# Patient Record
Sex: Female | Born: 1995 | Race: Black or African American | Hispanic: No | Marital: Single | Smoking: Never smoker
Health system: Southern US, Community
[De-identification: ages and names within clinical notes are randomized; demographics above are authoritative.]

## PROBLEM LIST (undated history)

## (undated) DIAGNOSIS — Z789 Other specified health status: Secondary | ICD-10-CM

## (undated) DIAGNOSIS — E559 Vitamin D deficiency, unspecified: Secondary | ICD-10-CM

## (undated) DIAGNOSIS — I2699 Other pulmonary embolism without acute cor pulmonale: Secondary | ICD-10-CM

## (undated) HISTORY — PX: NO PAST SURGERIES: SHX2092

## (undated) HISTORY — DX: Vitamin D deficiency, unspecified: E55.9

---
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
## 2016-11-17 ENCOUNTER — Encounter: Payer: Self-pay | Admitting: Physician Assistant

## 2016-11-17 ENCOUNTER — Ambulatory Visit: Payer: Self-pay | Admitting: Physician Assistant

## 2016-11-17 VITALS — BP 142/80 | HR 89 | Temp 98.3°F

## 2016-11-17 DIAGNOSIS — M549 Dorsalgia, unspecified: Secondary | ICD-10-CM

## 2016-11-17 LAB — POCT URINALYSIS DIPSTICK
Bilirubin, UA: NEGATIVE
Blood, UA: NEGATIVE
GLUCOSE UA: NEGATIVE
Ketones, UA: NEGATIVE
LEUKOCYTES UA: NEGATIVE
Nitrite, UA: NEGATIVE
Spec Grav, UA: 1.02
UROBILINOGEN UA: NEGATIVE
pH, UA: 7

## 2016-11-17 LAB — POCT URINE PREGNANCY: PREG TEST UR: NEGATIVE

## 2016-11-17 MED ORDER — MELOXICAM 15 MG PO TABS: 15.0000 mg | ORAL_TABLET | Freq: Every day | ORAL | 0 refills | Status: DC

## 2016-11-17 MED ORDER — BACLOFEN 10 MG PO TABS: 10.0000 mg | ORAL_TABLET | Freq: Three times a day (TID) | ORAL | 0 refills | Status: DC

## 2016-11-17 NOTE — Progress Notes (Signed)
S: c/o left sided back pain, increased pain with movement, no known injury, no uti sx, no abd pain, some radiation to top of thigh, nootc meds  O: vitals wnl, nad, spine is nontender, full rom, + large spasm in flank area on left side of back, n/v intact, ua wnl, urine preg neg  A: back pain with muscle spasms  P: mobic , baclofen, wet heat followed by ice, stretches

## 2016-12-17 ENCOUNTER — Ambulatory Visit: Payer: Self-pay | Admitting: Physician Assistant

## 2017-02-15 ENCOUNTER — Ambulatory Visit: Payer: Self-pay | Admitting: Physician Assistant

## 2017-04-06 LAB — HM PAP SMEAR: HM PAP: NORMAL

## 2017-06-10 ENCOUNTER — Ambulatory Visit: Payer: Self-pay | Admitting: Physician Assistant

## 2017-06-10 VITALS — BP 110/79 | HR 98 | Temp 98.5°F | Resp 16 | Ht 65.0 in | Wt 180.0 lb

## 2017-06-10 DIAGNOSIS — Z0000 Encounter for general adult medical examination without abnormal findings: Secondary | ICD-10-CM

## 2017-06-10 DIAGNOSIS — Z299 Encounter for prophylactic measures, unspecified: Secondary | ICD-10-CM

## 2017-06-10 NOTE — Patient Instructions (Addendum)
Eating Healthy on a Budget There are many ways to save money at the grocery store and continue to eat healthy. You can be successful if you plan your meals according to your budget, purchase according to your budget and grocery list, and prepare food yourself. How can I buy more food on a limited budget? Plan  Plan meals and snacks according to a grocery list and budget you create.  Look for recipes where you can cook once and make enough food for two meals.  Include meals that will "stretch" more expensive foods such as stews, casseroles, and stir-fry dishes.  Make a grocery list and make sure to bring it with you to the store. If you have a smart phone, you could use your phone to create your shopping list. Purchase  When grocery shopping, buy only the items on your grocery list and go only to the areas of the store that have the items on your list. Prepare  Some meal items can be prepared in advance. Pre-cook on days when you have extra time.  Make extra food (such as by doubling recipes) and freeze the extras in meal-sized containers or in individual portions for fast meals and snacks.  Use leftovers in your meal plan for the week.  Try some meatless meals or try "no cook" meals like salads.  When you come home from the grocery store, wash and prepare your fruits and vegetables so they are ready to use and eat. This will help reduce food waste. How can I buy more food on a limited budget? Try these tips the next time you go shopping:  Buy store brands or generic brands.  Use coupons only for foods and brands you normally buy. Avoid buying items you wouldn't normally buy simply because they are on sale.  Check online and in newspapers for weekly deals.  Buy healthy items from the bulk bins when available, such as herbs, spices, flours, pastas, nuts, and dried fruit.  Buy fruits and vegetables that are in season. Prices are usually lower on in-season produce.  Compare and  contrast different items. You can do this by looking at the unit price on the price tag. Use it to compare different brands and sizes to find out which item is the best deal.  Choose naturally low-cost healthy items, such as carrots, potatoes, apples, bananas, and oranges. Dried or canned beans are a low-cost protein source.  Buy in bulk and freeze extra food. Items you can buy in bulk include meats, fish, poultry, frozen fruits, and frozen vegetables.  Limit the purchase of prepared or "ready-to-eat" foods, such as pre-cut fruits and vegetables and pre-made salads.  If possible, shop around to discover which grocery store offers the best prices. Some stores charge much more than other stores for the same items.  Do not shop when you are hungry. If you shop while hungry, It may be hard to stick to your list and budget.  Stick to your list and resist impulse buys. Treat your list as your official plan for the week.  Buy a variety of vegetables and fruit by purchasing fresh, frozen, and canned items.  Look beyond eye level. Foods at eye level (adult or child eye level) are more expensive. Look at the top and bottom shelves for deals.  Be efficient with your time when shopping. The more time you spend at the store, the more money you are likely to spend.  Consider other retailers such as dollar stores, larger wholesale   stores, local fruit and vegetable stands, and farmers markets.  What are some tips for less expensive food substitutions? When choosing more expensive foods like meats and dairy, try these tips to save money:  Choose cheaper cuts of meat, such as bone-in chicken thighs and drumsticks instead skinless and boneless chicken. When you are ready to prepare the chicken, you can remove the skin yourself to make it healthier.  Choose lean meats like chicken or [place]. When choosing ground beef, make sure it is lean ground beef (92% lean, 8% fat). If you do buy a fattier ground beef,  drain the fat before eating.  Buy dried beans and peas, such as lentils, split peas, or kidney beans.  For seafood, choose canned tuna, salmon, or sardines.  Eggs are a low-cost source of protein.  Buy the larger tubs of yogurt instead of individual-sized containers.  Choose water instead of sodas and other sweetened beverages.  Skip buying chips, cookies, and other "junk food". These items are usually expensive, high in calories, and low in nutritional value.  How can I prepare the foods I buy in the healthiest way? Practice these tips for cooking foods in the healthiest way to reduce excess fat and calorie intake:  Steam, saute, grill, or bake foods instead of frying them.  Make sure half your plate is filled with fruits or vegetables. Choose from fresh, frozen, or canned fruits and vegetables. If eating canned, remember to rinse them before eating. This will remove any excess salt added for packaging.  Trim all fat from meat before cooking. Remove the skin from chicken or [place].  Spoon off fat from meat dishes once they have been chilled in the refrigerator and the fat has hardened on the top.  Use skim milk, low-fat milk, or evaporated skim milk when making cream sauces, soups, or puddings.  Substitute low-fat yogurt, sour cream, or cottage cheese for sour cream and mayonnaise in dips and dressings.  Try lemon juice, herbs, or spices to season food instead of salt, butter, or margarine.  This information is not intended to replace advice given to you by your health care provider. Make sure you discuss any questions you have with your health care provider. Document Released: 07/27/2014 Document Revised: 06/12/2016 Document Reviewed: 06/26/2014 Elsevier Interactive Patient Education  2018 [employer]. Preventing Unhealthy [employer], Adult Staying at a healthy weight is important. When fat builds up in your body, you may become overweight or obese. These conditions put you at  greater risk for developing certain health problems, such as heart disease, diabetes, sleeping problems, joint problems, and some cancers. Unhealthy weight gain is often the result of making unhealthy choices in what you eat. It is also a result of not getting enough exercise. You can make changes to your lifestyle to prevent obesity and stay as healthy as possible. What nutrition changes can be made? To maintain a healthy weight and prevent obesity:  Eat only as much as your body needs. To do this: ? Pay attention to signs that you are hungry or full. Stop eating as soon as you feel full. ? If you feel hungry, try drinking water first. Drink enough water so your urine is clear or pale yellow. ? Eat smaller portions. ? Look at serving sizes on food labels. Most foods contain more than one serving per container. ? Eat the recommended amount of calories for your gender and activity level. While most active people should eat around 2,000 calories per day, if you  are trying to lose weight or are not very active, you main need to eat less calories. Talk to your health care provider or dietitian about how many calories you should eat each day.  Choose healthy foods, such as: ? Fruits and vegetables. Try to fill at least half of your plate at each meal with fruits and vegetables. ? Whole grains, such as whole wheat bread, brown rice, and quinoa. ? Lean meats, such as chicken or fish. ? Other healthy proteins, such as beans, eggs, or tofu. ? Healthy fats, such as nuts, seeds, fatty fish, and olive oil. ? Low-fat or fat-free dairy.  Check food labels and avoid food and drinks that: ? Are high in calories. ? Have added sugar. ? Are high in sodium. ? Have saturated fats or trans fats.  Limit how much you eat of the following foods: ? Prepackaged meals. ? Fast food. ? Fried foods. ? Processed meat, such as bacon, sausage, and deli meats. ? Fatty cuts of red meat and poultry with skin.  Cook  foods in healthier ways, such as by baking, broiling, or grilling.  When grocery shopping, try to shop around the outside of the store. This helps you buy mostly fresh foods and avoid canned and prepackaged foods.  What lifestyle changes can be made?  Exercise at least 30 minutes 5 or more days each week. Exercising includes brisk walking, yard work, biking, running, swimming, and team sports like basketball and soccer. Ask your health care provider which exercises are safe for you.  Do not use any products that contain nicotine or tobacco, such as cigarettes and e-cigarettes. If you need help quitting, ask your health care provider.  Limit alcohol intake to no more than 1 drink a day for nonpregnant women and 2 drinks a day for men. One drink equals 12 oz of beer, 5 oz of wine, or 1 oz of hard liquor.  Try to get 7-9 hours of sleep each night. What other changes can be made?  Keep a food and activity journal to keep track of: ? What you ate and how many calories you had. Remember to count sauces, dressings, and side dishes. ? Whether you were active, and what exercises you did. ? Your calorie, weight, and activity goals.  Check your weight regularly. Track any changes. If you notice you have gained weight, make changes to your diet or activity routine.  Avoid taking weight-loss medicines or supplements. Talk to your health care provider before starting any new medicine or supplement.  Talk to your health care provider before trying any new diet or exercise plan. Why are these changes important? Eating healthy, staying active, and having healthy habits not only help prevent obesity, they also:  Help you to manage stress and emotions.  Help you to connect with friends and family.  Improve your self-esteem.  Improve your sleep.  Prevent long-term health problems.  What can happen if changes are not made? Being obese or overweight can cause you to develop joint or bone problems,  which can make it hard for you to stay active or do activities you enjoy. Being obese or overweight also puts stress on your heart and lungs and can lead to health problems like diabetes, heart disease, and some cancers. Where to find more information: Talk with your health care provider or a dietitian about healthy eating and healthy lifestyle choices. You may also find other information through these resources:  U.S. Department of Agriculture MyPlate: https://ball-collins.biz/www.choosemyplate.gov  American  Heart Association: www.heart.org  Centers for Disease Control and Prevention: FootballExhibition.com.br  Summary  Staying at a healthy weight is important. It helps prevent certain diseases and health problems, such as heart disease, diabetes, joint problems, sleep disorders, and some cancers.  Being obese or overweight can cause you to develop joint or bone problems, which can make it hard for you to stay active or do activities you enjoy.  You can prevent unhealthy weight gain by eating a healthy diet, exercising regularly, not smoking, limiting alcohol, and getting enough sleep.  Talk with your health care provider or a dietitian for guidance about healthy eating and healthy lifestyle choices. This information is not intended to replace advice given to you by your health care provider. Make sure you discuss any questions you have with your health care provider. Document Released: 11/24/2016 Document Revised: 12/30/2016 Document Reviewed: 12/30/2016 Elsevier Interactive Patient Education  [employer].

## 2017-06-10 NOTE — Progress Notes (Addendum)
Subjective:    Patient ID: Doris Thomas, female    DOB: 1996-11-12, 21 y.o.   MRN: [id number]  HPI Patient is a 21 year old female who presents to clinic for wellness exam. She denies any complaints. She does report wanting information on healthy weight and healthy diet. She denies smoking,drinking and is not sexually active. She requests Gynecology referral and reports recently having a breast exam that was " normal"  Per patients report. Patient defers breast exam at today's visit. Last menstrual period 06/01/17- cycles reported as regular.  No Known Allergies  BMI 29.95 Height 5 foot 5 inches  Weight 180 lbs  . Review of Systems  Constitutional: Negative for activity change, appetite change, chills, diaphoresis, fatigue, fever and unexpected weight change.  HENT: Negative for congestion, dental problem, drooling, ear discharge, ear pain, facial swelling, hearing loss, mouth sores, nosebleeds, postnasal drip, rhinorrhea, sinus pain, sinus pressure, sneezing, sore throat, tinnitus, trouble swallowing and voice change.   Eyes: Negative.   Respiratory: Negative for apnea, cough, choking, chest tightness, shortness of breath, wheezing and stridor.   Cardiovascular: Negative for chest pain, palpitations and leg swelling.  Gastrointestinal: Negative for abdominal distention, abdominal pain, anal bleeding, blood in stool, constipation, diarrhea, nausea, rectal pain and vomiting.  Endocrine: Negative.   Genitourinary: Negative.   Musculoskeletal: Negative.   Skin: Negative.  Negative for color change, pallor, rash and wound.  Allergic/Immunologic: Negative for environmental allergies, food allergies and immunocompromised state.  Neurological: Negative for dizziness, tremors, seizures, syncope, facial asymmetry, speech difficulty, weakness, light-headedness, numbness and headaches.  Hematological: Negative for adenopathy. Does not bruise/bleed easily.  Psychiatric/Behavioral: Negative for  agitation, behavioral problems, confusion, decreased concentration, dysphoric mood, hallucinations, self-injury, sleep disturbance and suicidal ideas. The patient is not nervous/anxious and is not hyperactive.        Objective:   Physical Exam  Constitutional: She is oriented to person, place, and time. She appears well-developed and well-nourished. No distress.  HENT:  Head: Normocephalic and atraumatic.  Right Ear: External ear normal.  Left Ear: External ear normal.  Nose: Nose normal.  Mouth/Throat: Oropharynx is clear and moist. No oropharyngeal exudate.  Eyes: Conjunctivae and EOM are normal. Pupils are equal, round, and reactive to light. Right eye exhibits no discharge. Left eye exhibits no discharge. No scleral icterus.  Neck: Normal range of motion. Neck supple. No JVD present. No tracheal deviation present. No thyromegaly present.  Cardiovascular: Normal rate, regular rhythm, normal heart sounds and intact distal pulses.  Exam reveals no gallop and no friction rub.   No murmur heard. Pulmonary/Chest: Effort normal and breath sounds normal. No stridor. No respiratory distress. She has no wheezes. She has no rales. She exhibits no tenderness.  Abdominal: Soft. Bowel sounds are normal. She exhibits no distension and no mass. There is no tenderness. There is no rebound and no guarding.  Musculoskeletal: Normal range of motion. She exhibits no edema, tenderness or deformity.  Lymphadenopathy:    She has no cervical adenopathy.  Neurological: She is alert and oriented to person, place, and time. She has normal reflexes.  Skin: Skin is warm and dry. No rash noted. She is not diaphoretic. No erythema. No pallor.  Psychiatric: She has a normal mood and affect. Her behavior is normal. Judgment and thought content normal.  Vitals reviewed.         Assessment & Plan:  1. Wellness Exam 2. Provider will update my chart with weight management.  3. Executive panel labs ordered  and drawn  at this visit.  4. Patient will follow up with clinic next week for labs and as needed.  5. GYN referral per patient request for GYN and Breast exam. Patient advised to call within a week if no call from gynecology referral.  6. Patient verbalizes understanding of instructions above and will follow up as needed.

## 2017-06-11 LAB — CMP12+LP+TP+TSH+6AC+CBC/D/PLT
ALT: 12 IU/L (ref 0–32)
AST: 17 IU/L (ref 0–40)
Albumin/Globulin Ratio: 1.3 (ref 1.2–2.2)
Albumin: 4.6 g/dL (ref 3.5–5.5)
Alkaline Phosphatase: 103 IU/L (ref 39–117)
BUN/Creatinine Ratio: 9 (ref 9–23)
BUN: 6 mg/dL (ref 6–20)
Basophils Absolute: 0.1 10*3/uL (ref 0.0–0.2)
Basos: 1 %
Bilirubin Total: 0.3 mg/dL (ref 0.0–1.2)
CALCIUM: 9.6 mg/dL (ref 8.7–10.2)
CHOL/HDL RATIO: 2.6 ratio (ref 0.0–4.4)
CREATININE: 0.69 mg/dL (ref 0.57–1.00)
Chloride: 101 mmol/L (ref 96–106)
Cholesterol, Total: 109 mg/dL (ref 100–199)
EOS (ABSOLUTE): 0.1 10*3/uL (ref 0.0–0.4)
Eos: 1 %
Estimated CHD Risk: <0.5 times avg. (ref 0.0–1.0)
Free Thyroxine Index: 1.5 (ref 1.2–4.9)
GFR calc Af Amer: 143 mL/min/{1.73_m2} (ref >59)
GFR, EST NON AFRICAN AMERICAN: 124 mL/min/{1.73_m2} (ref >59)
GGT: 19 IU/L (ref 0–60)
GLUCOSE: 86 mg/dL (ref 65–99)
Globulin, Total: 3.6 g/dL (ref 1.5–4.5)
HDL: 42 mg/dL (ref >39)
Hematocrit: 41.7 % (ref 34.0–46.6)
Hemoglobin: 13.5 g/dL (ref 11.1–15.9)
Immature Grans (Abs): 0 10*3/uL (ref 0.0–0.1)
Immature Granulocytes: 0 %
Iron: 27 ug/dL (ref 27–159)
LDH: 196 IU/L (ref 119–226)
LDL Calculated: 52 mg/dL (ref 0–99)
Lymphocytes Absolute: 2 10*3/uL (ref 0.7–3.1)
Lymphs: 28 %
MCH: 27.8 pg (ref 26.6–33.0)
MCHC: 32.4 g/dL (ref 31.5–35.7)
MCV: 86 fL (ref 79–97)
MONOCYTES: 6 %
Monocytes Absolute: 0.4 10*3/uL (ref 0.1–0.9)
NEUTROS ABS: 4.5 10*3/uL (ref 1.4–7.0)
Neutrophils: 64 %
PHOSPHORUS: 3.3 mg/dL (ref 2.5–4.5)
POTASSIUM: 4.7 mmol/L (ref 3.5–5.2)
Platelets: 273 10*3/uL (ref 150–379)
RBC: 4.85 x10E6/uL (ref 3.77–5.28)
RDW: 16.5 % — AB (ref 12.3–15.4)
Sodium: 138 mmol/L (ref 134–144)
T3 Uptake Ratio: 26 % (ref 24–39)
T4 TOTAL: 5.9 ug/dL (ref 4.5–12.0)
TSH: 0.917 u[IU]/mL (ref 0.450–4.500)
Total Protein: 8.2 g/dL (ref 6.0–8.5)
Triglycerides: 76 mg/dL (ref 0–149)
URIC ACID: 4.2 mg/dL (ref 2.5–7.1)
VLDL Cholesterol Cal: 15 mg/dL (ref 5–40)
WBC: 7.1 10*3/uL (ref 3.4–10.8)

## 2017-06-11 LAB — VITAMIN D 25 HYDROXY (VIT D DEFICIENCY, FRACTURES): VIT D 25 HYDROXY: 9.8 ng/mL — AB (ref 30.0–100.0)

## 2017-06-12 ENCOUNTER — Encounter: Payer: Self-pay | Admitting: Physician Assistant

## 2017-06-14 MED ORDER — VITAMIN D (ERGOCALCIFEROL) 1.25 MG (50000 UNIT) PO CAPS: 50000.0000 [IU] | ORAL_CAPSULE | ORAL | 3 refills | Status: DC

## 2017-06-14 NOTE — Telephone Encounter (Signed)
Sent to pharmacy 

## 2017-06-14 NOTE — Telephone Encounter (Signed)
Patient wants Vitamin D script called in to [facility] [place].  Thanks

## 2017-07-15 ENCOUNTER — Encounter: Payer: Self-pay | Admitting: Physician Assistant

## 2017-07-15 ENCOUNTER — Ambulatory Visit: Payer: Self-pay | Admitting: Physician Assistant

## 2017-07-15 VITALS — BP 130/80 | HR 106 | Temp 98.9°F

## 2017-07-15 DIAGNOSIS — T50905A Adverse effect of unspecified drugs, medicaments and biological substances, initial encounter: Secondary | ICD-10-CM

## 2017-07-15 MED ORDER — VITAMIN D (ERGOCALCIFEROL) 1.25 MG (50000 UNIT) PO CAPS: 50000.0000 [IU] | ORAL_CAPSULE | ORAL | 3 refills | Status: DC

## 2017-07-15 NOTE — Progress Notes (Signed)
S: had a headache after taking energy pills prior to working out, got so bad Doris Thomas felt like it was a migraine, since Doris Thomas stopped taking the energy pill the headache has gone away, no v/d, no photosensitivity, no change in vision  O: vitals wnl, nad, ent wnl, neck supple no lymph lungs c t a, cv rrr, cn II-XII grossly intact  A: headache secondary to energy pills  P: stop taking energy pills , drink plenty of water to flush medication out, if headaches return without use of energy pills then return to clinic for eval

## 2017-07-22 ENCOUNTER — Encounter: Payer: Self-pay | Admitting: Physician Assistant

## 2017-07-22 ENCOUNTER — Ambulatory Visit: Payer: Self-pay | Admitting: Physician Assistant

## 2017-07-22 VITALS — BP 110/80 | HR 89 | Temp 98.1°F

## 2017-07-22 DIAGNOSIS — R300 Dysuria: Secondary | ICD-10-CM

## 2017-07-22 LAB — POCT URINALYSIS DIPSTICK
Glucose, UA: NEGATIVE
NITRITE UA: NEGATIVE
PH UA: 6 (ref 5.0–8.0)
UROBILINOGEN UA: 1 U/dL

## 2017-07-22 MED ORDER — SULFAMETHOXAZOLE-TRIMETHOPRIM 800-160 MG PO TABS: 1.0000 | ORAL_TABLET | Freq: Two times a day (BID) | ORAL | 0 refills | Status: DC

## 2017-07-22 MED ORDER — PHENAZOPYRIDINE HCL 200 MG PO TABS: 200.0000 mg | ORAL_TABLET | Freq: Three times a day (TID) | ORAL | 0 refills | Status: DC | PRN

## 2017-07-22 NOTE — Progress Notes (Signed)
   Subjective: UTI    Patient ID: Starlyn SkeansKaila A Modica, female    DOB: 1995-12-21, 21 y.o.   MRN: [id number]  HPI Patient c/o dysuria, frequency, and urgency for 3 days. Denies flank pain, fever, or vaginal discharge. No palliative measures for compliant.   Review of Systems Negative except for compliant.    Objective:   Physical Exam Dip UA positive for leukocytes.       Assessment & Plan: UTI  Bactrim DS and Pyridium as directed. Follow up with PCP.

## 2017-09-28 ENCOUNTER — Ambulatory Visit: Payer: Self-pay | Admitting: Physician Assistant

## 2017-09-30 ENCOUNTER — Ambulatory Visit: Payer: Self-pay | Admitting: Physician Assistant

## 2017-09-30 ENCOUNTER — Encounter: Payer: Self-pay | Admitting: Physician Assistant

## 2017-09-30 VITALS — BP 142/83 | HR 85 | Temp 98.4°F | Resp 16

## 2017-09-30 DIAGNOSIS — Z7689 Persons encountering health services in other specified circumstances: Secondary | ICD-10-CM

## 2017-09-30 NOTE — Progress Notes (Signed)
S: pt states she had v/d earlier in the week, is feeling better but her work wants a note saying she is well enough to return, no v/d in over 24hours  O: vitals wnl, nad, ENT wnl, neck supple no lymph, lungs c t ,a cv rrr, abd soft nontender bs normal all 4 quads  A: well appearing adult  P: return to work

## 2017-10-13 ENCOUNTER — Ambulatory Visit: Payer: Self-pay | Admitting: Physician Assistant

## 2017-10-14 ENCOUNTER — Ambulatory Visit: Payer: Self-pay | Admitting: Physician Assistant

## 2017-10-14 DIAGNOSIS — Z299 Encounter for prophylactic measures, unspecified: Secondary | ICD-10-CM

## 2017-10-14 NOTE — Progress Notes (Signed)
Patient in office today for Flu shot only. Givem IM left deltoid. VIS and consent form was given to  Patient.

## 2017-10-18 ENCOUNTER — Encounter: Payer: Self-pay | Admitting: Unknown Physician Specialty

## 2017-10-18 ENCOUNTER — Ambulatory Visit (INDEPENDENT_AMBULATORY_CARE_PROVIDER_SITE_OTHER): Payer: Managed Care, Other (non HMO) | Admitting: Unknown Physician Specialty

## 2017-10-18 DIAGNOSIS — E559 Vitamin D deficiency, unspecified: Secondary | ICD-10-CM | POA: Insufficient documentation

## 2017-10-18 DIAGNOSIS — Z30011 Encounter for initial prescription of contraceptive pills: Secondary | ICD-10-CM | POA: Diagnosis not present

## 2017-10-18 DIAGNOSIS — Z309 Encounter for contraceptive management, unspecified: Secondary | ICD-10-CM | POA: Insufficient documentation

## 2017-10-18 DIAGNOSIS — E663 Overweight: Secondary | ICD-10-CM | POA: Diagnosis not present

## 2017-10-18 MED ORDER — VITAMIN D (ERGOCALCIFEROL) 1.25 MG (50000 UNIT) PO CAPS: 50000.0000 [IU] | ORAL_CAPSULE | ORAL | 0 refills | Status: DC

## 2017-10-18 MED ORDER — LEVONORGESTREL-ETHINYL ESTRAD 0.1-20 MG-MCG PO TABS: 1.0000 | ORAL_TABLET | Freq: Every day | ORAL | 11 refills | Status: DC

## 2017-10-18 NOTE — Patient Instructions (Addendum)
Oral Contraception Information Oral contraceptive pills (OCPs) are medicines taken to prevent pregnancy. OCPs work by preventing the ovaries from releasing eggs. The hormones in OCPs also cause the cervical mucus to thicken, preventing the sperm from entering the uterus. The hormones also cause the uterine lining to become thin, not allowing a fertilized egg to attach to the inside of the uterus. OCPs are highly effective when taken exactly as prescribed. However, OCPs do not prevent sexually transmitted diseases (STDs). Safe sex practices, such as using condoms along with the pill, can help prevent STDs. Before taking the pill, you may have a physical exam and Pap test. Your health care provider may order blood tests. The health care provider will make sure you are a good candidate for oral contraception. Discuss with your health care provider the possible side effects of the OCP you may be prescribed. When starting an OCP, it can take 2 to 3 months for the body to adjust to the changes in hormone levels in your body. Types of oral contraception  The combination pill-This pill contains estrogen and progestin (synthetic progesterone) hormones. The combination pill comes in 21-day, 28-day, or 91-day packs. Some types of combination pills are meant to be taken continuously (365-day pills). With 21-day packs, you do not take pills for 7 days after the last pill. With 28-day packs, the pill is taken every day. The last 7 pills are without hormones. Certain types of pills have more than 21 hormone-containing pills. With 91-day packs, the first 84 pills contain both hormones, and the last 7 pills contain no hormones or contain estrogen only.  The minipill-This pill contains the progesterone hormone only. The pill is taken every day continuously. It is very important to take the pill at the same time each day. The minipill comes in packs of 28 pills. All 28 pills contain the hormone. Advantages of oral  contraceptive pills  Decreases premenstrual symptoms.  Treats menstrual period cramps.  Regulates the menstrual cycle.  Decreases a heavy menstrual flow.  May treatacne, depending on the type of pill.  Treats abnormal uterine bleeding.  Treats polycystic ovarian syndrome.  Treats endometriosis.  Can be used as emergency contraception. Things that can make oral contraceptive pills less effective OCPs can be less effective if:  You forget to take the pill at the same time every day.  You have a stomach or intestinal disease that lessens the absorption of the pill.  You take OCPs with other medicines that make OCPs less effective, such as antibiotics, certain HIV medicines, and some seizure medicines.  You take expired OCPs.  You forget to restart the pill on day 7, when using the packs of 21 pills.  Risks associated with oral contraceptive pills Oral contraceptive pills can sometimes cause side effects, such as:  Headache.  Nausea.  Breast tenderness.  Irregular bleeding or spotting.  Combination pills are also associated with a small increased risk of:  Blood clots.  Heart attack.  Stroke.  This information is not intended to replace advice given to you by your health care provider. Make sure you discuss any questions you have with your health care provider. Document Released: 02/13/2003 Document Revised: 04/30/2016 Document Reviewed: 05/14/2013 Elsevier Interactive Patient Education  2018 [employer]  Oral Contraception Use Oral contraceptive pills (OCPs) are medicines taken to prevent pregnancy. OCPs work by preventing the ovaries from releasing eggs. The hormones in OCPs also cause the cervical mucus to thicken, preventing the sperm from entering the uterus. The  hormones also cause the uterine lining to become thin, not allowing a fertilized egg to attach to the inside of the uterus. OCPs are highly effective when taken exactly as prescribed. However,  OCPs do not prevent sexually transmitted diseases (STDs). Safe sex practices, such as using condoms along with an OCP, can help prevent STDs. Before taking OCPs, you may have a physical exam and Pap test. Your health care provider may also order blood tests if necessary. Your health care provider will make sure you are a good candidate for oral contraception. Discuss with your health care provider the possible side effects of the OCP you may be prescribed. When starting an OCP, it can take 2 to 3 months for the body to adjust to the changes in hormone levels in your body. How to take oral contraceptive pills Your health care provider may advise you on how to start taking the first cycle of OCPs. Otherwise, you can:  Start on day 1 of your menstrual period. You will not need any backup contraceptive protection with this start time.  Start on the first Sunday after your menstrual period or the day you get your prescription. In these cases, you will need to use backup contraceptive protection for the first week.  Start the pill at any time of your cycle. If you take the pill within 5 days of the start of your period, you are protected against pregnancy right away. In this case, you will not need a backup form of birth control. If you start at any other time of your menstrual cycle, you will need to use another form of birth control for 7 days. If your OCP is the type called a minipill, it will protect you from pregnancy after taking it for 2 days (48 hours).  After you have started taking OCPs:  If you forget to take 1 pill, take it as soon as you remember. Take the next pill at the regular time.  If you miss 2 or more pills, call your health care provider because different pills have different instructions for missed doses. Use backup birth control until your next menstrual period starts.  If you use a 28-day pack that contains inactive pills and you miss 1 of the last 7 pills (pills with no  hormones), it will not matter. Throw away the rest of the non-hormone pills and start a new pill pack.  No matter which day you start the OCP, you will always start a new pack on that same day of the week. Have an extra pack of OCPs and a backup contraceptive method available in case you miss some pills or lose your OCP pack. Follow these instructions at home:  Do not smoke.  Always use a condom to protect against STDs. OCPs do not protect against STDs.  Use a calendar to mark your menstrual period days.  Read the information and directions that came with your OCP. Talk to your health care provider if you have questions. Contact a health care provider if:  You develop nausea and vomiting.  You have abnormal vaginal discharge or bleeding.  You develop a rash.  You miss your menstrual period.  You are losing your hair.  You need treatment for mood swings or depression.  You get dizzy when taking the OCP.  You develop acne from taking the OCP.  You become pregnant. Get help right away if:  You develop chest pain.  You develop shortness of breath.  You have an uncontrolled or  severe headache.  You develop numbness or slurred speech.  You develop visual problems.  You develop pain, redness, and swelling in the legs. This information is not intended to replace advice given to you by your health care provider. Make sure you discuss any questions you have with your health care provider. Document Released: 11/12/2011 Document Revised: 04/30/2016 Document Reviewed: 05/14/2013 Elsevier Interactive Patient Education  2017 [employer] Inc[employer] BMI for Adults Body mass index (BMI) is a number that is calculated from a person's weight and height. In most adults, the number is used to find how much of an adult's weight is made up of fat. BMI is not as accurate as a direct measure of body fat. How is BMI calculated? BMI is calculated by dividing weight in kilograms by height in meters  squared. It can also be calculated by dividing weight in pounds by height in inches squared, then multiplying the resulting number by 703. Charts are available to help you find your BMI quickly and easily without doing this calculation. How is BMI interpreted? Health care professionals use BMI charts to identify whether an adult is underweight, at a normal weight, or overweight based on the following guidelines:  Underweight: BMI less than 18.5.  Normal weight: BMI between 18.5 and 24.9.  Overweight: BMI between 25 and 29.9.  Obese: BMI of 30 and above.  BMI is usually interpreted the same for males and females. Weight includes both fat and muscle, so someone with a muscular build, such as an athlete, may have a BMI that is higher than 24.9. In cases like these, BMI may not accurately depict body fat. To determine if excess body fat is the cause of a BMI of 25 or higher, further assessments may need to be done by a health care provider. Why is BMI a useful tool? BMI is used to identify a possible weight problem that may be related to a medical problem or may increase the risk for medical problems. BMI can also be used to promote changes to reach a healthy weight. This information is not intended to replace advice given to you by your health care provider. Make sure you discuss any questions you have with your health care provider. Document Released: 08/04/2004 Document Revised: 04/02/2016 Document Reviewed: 04/20/2014 Elsevier Interactive Patient Education  2018 [employer].  Vitamin D Deficiency Vitamin D deficiency is when your body does not have enough vitamin D. Vitamin D is important to your body for many reasons:  It helps the body to absorb two important minerals, called calcium and phosphorus.  It plays a role in bone health.  It may help to prevent some diseases, such as diabetes and multiple sclerosis.  It plays a role in muscle function, including heart function.  You can  get vitamin D by:  Eating foods that naturally contain vitamin D.  Eating or drinking milk or other dairy products that have vitamin D added to them.  Taking a vitamin D supplement or a multivitamin supplement that contains vitamin D.  Being in the sun. Your body naturally makes vitamin D when your skin is exposed to sunlight. Your body changes the sunlight into a form of the vitamin that the body can use.  If vitamin D deficiency is severe, it can cause a condition in which your bones become soft. In adults, this condition is called osteomalacia. In children, this condition is called rickets. What are the causes? Vitamin D deficiency may be caused by:  Not eating enough foods  that contain vitamin D.  Not getting enough sun exposure.  Having certain digestive system diseases that make it difficult for your body to absorb vitamin D. These diseases include Crohn disease, chronic pancreatitis, and cystic fibrosis.  Having a surgery in which a part of the stomach or a part of the small intestine is removed.  Being obese.  Having chronic kidney disease or liver disease.  What increases the risk? This condition is more likely to develop in:  Older people.  People who do not spend much time outdoors.  People who live in a long-term care facility.  People who have had broken bones.  People with weak or thin bones (osteoporosis).  People who have a disease or condition that changes how the body absorbs vitamin D.  People who have dark skin.  People who take certain medicines, such as steroid medicines or certain seizure medicines.  People who are overweight or obese.  What are the signs or symptoms? In mild cases of vitamin D deficiency, there may not be any symptoms. If the condition is severe, symptoms may include:  Bone pain.  Muscle pain.  Falling often.  Broken bones caused by a minor injury.  How is this diagnosed? This condition is usually diagnosed with a  blood test. How is this treated? Treatment for this condition may depend on what caused the condition. Treatment options include:  Taking vitamin D supplements.  Taking a calcium supplement. Your health care provider will suggest what dose is best for you.  Follow these instructions at home:  Take medicines and supplements only as told by your health care provider.  Eat foods that contain vitamin D. Choices include: ? Fortified dairy products, cereals, or juices. Fortified means that vitamin D has been added to the food. Check the label on the package to be sure. ? Fatty fish, such as salmon or trout. ? Eggs. ? Oysters.  Do not use a tanning bed.  Maintain a healthy weight. Lose weight, if needed.  Keep all follow-up visits as told by your health care provider. This is important. Contact a health care provider if:  Your symptoms do not go away.  You feel like throwing up (nausea) or you throw up (vomit).  You have fewer bowel movements than usual or it is difficult for you to have a bowel movement (constipation). This information is not intended to replace advice given to you by your health care provider. Make sure you discuss any questions you have with your health care provider. Document Released: 02/15/2012 Document Revised: 05/06/2016 Document Reviewed: 04/10/2015 Elsevier Interactive Patient Education  2018 [employer]

## 2017-10-18 NOTE — Assessment & Plan Note (Signed)
BP a little high today.  ABP 144 on recheck.  This is unusual for her.  No history of migraine with aura.  Will start BCPs, get a BP check in 1 month at work, and recheck here in 3 months

## 2017-10-18 NOTE — Assessment & Plan Note (Signed)
Start weekly Vitamin D and recheck in 3 months

## 2017-10-18 NOTE — Progress Notes (Signed)
BP (!) 144/80   Pulse (!) 109   Temp 98.4 F (36.9 C) (Oral)   Ht 5\' 5"  (1.651 m)   Wt 181 lb 3.2 oz (82.2 kg)   LMP 10/15/2017 (Exact Date)   SpO2 100%   BMI 30.15 kg/m    Subjective:    Patient ID: Doris Thomas, female    DOB: [birth year], 21 y.o.   MRN: [id number]  HPI: Doris Thomas is a 21 y.o. female  Chief Complaint  Patient presents with  . Establish Care    pt states she would like to discuss weight, birth control, and vitamin D deficiency    Weight Pt states she is considered obese and wanting to know if something can be given.  States she goes to the gym and suspect her eating habits aren't good.  She admits to fast food and soft drinks.    Birth control Would like something for birth control   She would like to start birth control pills.  No migraines with aura.    Vitamin D deficiency Vit D level was 9.6 on routine screening.    Social History   Socioeconomic History  . Marital status: Single    Spouse name: Not on file  . Number of children: Not on file  . Years of education: Not on file  . Highest education level: Not on file  Social Needs  . Financial resource strain: Not on file  . Food insecurity - worry: Not on file  . Food insecurity - inability: Not on file  . Transportation needs - medical: Not on file  . Transportation needs - non-medical: Not on file  Occupational History  . Not on file  Tobacco Use  . Smoking status: Never Smoker  . Smokeless tobacco: Never Used  Substance and Sexual Activity  . Alcohol use: No    Frequency: Never  . Drug use: No  . Sexual activity: Yes  Other Topics Concern  . Not on file  Social History Narrative  . Not on file   Family History  Problem Relation Age of Onset  . Cancer Maternal Grandmother   . Diabetes Paternal Grandmother    Past Medical History:  Diagnosis Date  . Vitamin D deficiency    History reviewed. No pertinent surgical history.   Relevant past medical, surgical, family and  social history reviewed and updated as indicated. Interim medical history since our last visit reviewed. Allergies and medications reviewed and updated.  Review of Systems  Per HPI unless specifically indicated above     Objective:    BP (!) 144/80   Pulse (!) 109   Temp 98.4 F (36.9 C) (Oral)   Ht 5\' 5"  (1.651 m)   Wt 181 lb 3.2 oz (82.2 kg)   LMP 10/15/2017 (Exact Date)   SpO2 100%   BMI 30.15 kg/m   Wt Readings from Last 3 Encounters:  10/18/17 181 lb 3.2 oz (82.2 kg)  06/10/17 180 lb (81.6 kg)    Physical Exam  Constitutional: She is oriented to person, place, and time. She appears well-developed and well-nourished. No distress.  HENT:  Head: Normocephalic and atraumatic.  Eyes: Conjunctivae and lids are normal. Right eye exhibits no discharge. Left eye exhibits no discharge. No scleral icterus.  Neck: Normal range of motion. Neck supple. No JVD present. Carotid bruit is not present.  Cardiovascular: Normal rate, regular rhythm and normal heart sounds.  Pulmonary/Chest: Effort normal and breath sounds normal.  Abdominal: Normal appearance.  There is no splenomegaly or hepatomegaly.  Musculoskeletal: Normal range of motion.  Neurological: She is alert and oriented to person, place, and time.  Skin: Skin is warm, dry and intact. No rash noted. No pallor.  Psychiatric: She has a normal mood and affect. Her behavior is normal. Judgment and thought content normal.    Results for orders placed or performed in visit on 10/18/17  HM PAP SMEAR  Result Value Ref Range   HM Pap smear normal- patient reported       Assessment & Plan:   Problem List Items Addressed This Visit      Unprioritized   Family planning, BCP (birth control pills) initial prescription    BP a little high today.  ABP 144 on recheck.  This is unusual for her.  No history of migraine with aura.  Will start BCPs, get a BP check in 1 month at work, and recheck here in 3 months      Overweight (BMI  25.0-29.9)    Pt ed. On soft drinks and sweet tea.  Phentermine not given in this office.        Vitamin D deficiency    Start weekly Vitamin D and recheck in 3 months          Follow up plan: Return in about 3 months (around 01/18/2018) for physical.

## 2017-10-18 NOTE — Assessment & Plan Note (Signed)
Pt ed. On soft drinks and sweet tea.  Phentermine not given in this office.

## 2017-10-26 ENCOUNTER — Telehealth: Payer: Self-pay | Admitting: Obstetrics and Gynecology

## 2017-10-26 NOTE — Telephone Encounter (Signed)
Patient called and stated that she has her first appointment with our office tomorrow 10/27/17 and the patient wanted to make sure our office was able to prescribe Phentermine, No other information was disclosed. Please advise.

## 2017-10-27 ENCOUNTER — Encounter: Payer: Managed Care, Other (non HMO) | Admitting: Obstetrics and Gynecology

## 2017-11-02 ENCOUNTER — Encounter: Payer: Self-pay | Admitting: Obstetrics and Gynecology

## 2017-11-02 ENCOUNTER — Encounter: Payer: Managed Care, Other (non HMO) | Admitting: Obstetrics and Gynecology

## 2017-11-02 ENCOUNTER — Ambulatory Visit (INDEPENDENT_AMBULATORY_CARE_PROVIDER_SITE_OTHER): Payer: Managed Care, Other (non HMO) | Admitting: Obstetrics and Gynecology

## 2017-11-02 VITALS — BP 124/84 | HR 111 | Ht 65.0 in | Wt 174.8 lb

## 2017-11-02 DIAGNOSIS — Z01419 Encounter for gynecological examination (general) (routine) without abnormal findings: Secondary | ICD-10-CM | POA: Diagnosis not present

## 2017-11-02 DIAGNOSIS — E559 Vitamin D deficiency, unspecified: Secondary | ICD-10-CM

## 2017-11-02 NOTE — Patient Instructions (Signed)
Place annual gynecologic exam patient instructions here.

## 2017-11-02 NOTE — Progress Notes (Signed)
  Subjective:     Doris Thomas is a single black 21 y.o. female and is here for a comprehensive physical exam. 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 Marital status: Single    Spouse name: Not on file  . Number of children: Not on file  . Years of education: Not on file  . Highest education level: Not on file  Social Needs  . Financial resource strain: Not on file  . Food insecurity - worry: Not on file  . Food insecurity - inability: Not on file  . Transportation needs - medical: Not on file  . Transportation needs - non-medical: Not on file  Occupational History  . Not on file  Tobacco Use  . Smoking status: Never Smoker  . Smokeless tobacco: Never Used  Substance and Sexual Activity  . Alcohol use: No    Frequency: Never  . Drug use: No  . Sexual activity: Yes    Birth control/protection: Pill  Other Topics Concern  . Not on file  Social History Narrative  . Not on file   Health Maintenance  Topic Date Due  . HIV Screening  12/29/2009  . PAP SMEAR  04/06/2020  . TETANUS/TDAP  12/08/2025  . INFLUENZA VACCINE  Completed    The following portions of the patient's history were reviewed and updated as appropriate: allergies, current medications, past family history, past medical history, past social history, past surgical history and problem list.  Review of Systems A comprehensive review of systems was negative.   Objective:    General appearance: alert, cooperative and appears stated age Neck: no adenopathy, no carotid bruit, no JVD, supple, symmetrical, trachea midline and thyroid not enlarged, symmetric, no tenderness/mass/nodules Back: symmetric, no curvature. ROM normal. No CVA tenderness. Lungs: clear to auscultation bilaterally Breasts: normal appearance, no masses or tenderness Pelvic: cervix normal in appearance, external genitalia normal, no adnexal masses or tenderness, no cervical motion  tenderness, rectovaginal septum normal, uterus normal size, shape, and consistency and vagina normal without discharge    Assessment:    Healthy female exam.  HC user  Vitamin d deficiency overweight     Plan:     See After Visit Summary for Counseling Recommendations

## 2017-11-03 LAB — COMPREHENSIVE METABOLIC PANEL
ALBUMIN: 4.7 g/dL (ref 3.5–5.5)
ALK PHOS: 71 IU/L (ref 39–117)
ALT: 7 IU/L (ref 0–32)
AST: 14 IU/L (ref 0–40)
Albumin/Globulin Ratio: 1.1 — ABNORMAL LOW (ref 1.2–2.2)
BILIRUBIN TOTAL: 0.5 mg/dL (ref 0.0–1.2)
BUN / CREAT RATIO: 12 (ref 9–23)
BUN: 10 mg/dL (ref 6–20)
CO2: 22 mmol/L (ref 20–29)
Calcium: 9.5 mg/dL (ref 8.7–10.2)
Chloride: 101 mmol/L (ref 96–106)
Creatinine, Ser: 0.85 mg/dL (ref 0.57–1.00)
GFR calc non Af Amer: 98 mL/min/{1.73_m2} (ref >59)
GFR, EST AFRICAN AMERICAN: 112 mL/min/{1.73_m2} (ref >59)
GLUCOSE: 79 mg/dL (ref 65–99)
Globulin, Total: 4.1 g/dL (ref 1.5–4.5)
Potassium: 3.6 mmol/L (ref 3.5–5.2)
Sodium: 139 mmol/L (ref 134–144)
Total Protein: 8.8 g/dL — ABNORMAL HIGH (ref 6.0–8.5)

## 2017-11-03 LAB — CBC
HEMOGLOBIN: 11.9 g/dL (ref 11.1–15.9)
Hematocrit: 37.2 % (ref 34.0–46.6)
MCH: 26.7 pg (ref 26.6–33.0)
MCHC: 32 g/dL (ref 31.5–35.7)
MCV: 84 fL (ref 79–97)
PLATELETS: 373 10*3/uL (ref 150–379)
RBC: 4.45 x10E6/uL (ref 3.77–5.28)
RDW: 16.4 % — ABNORMAL HIGH (ref 12.3–15.4)
WBC: 8.2 10*3/uL (ref 3.4–10.8)

## 2017-11-03 LAB — THYROID PANEL WITH TSH
FREE THYROXINE INDEX: 2.3 (ref 1.2–4.9)
T3 UPTAKE RATIO: 22 % — AB (ref 24–39)
T4 TOTAL: 10.3 ug/dL (ref 4.5–12.0)
TSH: 1.92 u[IU]/mL (ref 0.450–4.500)

## 2017-11-03 LAB — VITAMIN D 25 HYDROXY (VIT D DEFICIENCY, FRACTURES): VIT D 25 HYDROXY: 35.7 ng/mL (ref 30.0–100.0)

## 2017-11-03 LAB — FERRITIN: FERRITIN: 13 ng/mL — AB (ref 15–150)

## 2017-11-03 LAB — B12 AND FOLATE PANEL
FOLATE: 14 ng/mL (ref >3.0)
Vitamin B-12: 916 pg/mL (ref 232–1245)

## 2017-11-03 LAB — HEMOGLOBIN A1C
Est. average glucose Bld gHb Est-mCnc: 103 mg/dL
HEMOGLOBIN A1C: 5.2 % (ref 4.8–5.6)

## 2017-11-03 LAB — HIV ANTIBODY (ROUTINE TESTING W REFLEX): HIV SCREEN 4TH GENERATION: NONREACTIVE

## 2017-11-04 LAB — GC/CHLAMYDIA PROBE AMP
Chlamydia trachomatis, NAA: NEGATIVE
NEISSERIA GONORRHOEAE BY PCR: NEGATIVE

## 2017-11-09 ENCOUNTER — Encounter: Payer: Self-pay | Admitting: Obstetrics and Gynecology

## 2017-11-12 ENCOUNTER — Other Ambulatory Visit: Payer: Self-pay | Admitting: Obstetrics and Gynecology

## 2017-11-12 MED ORDER — PHENTERMINE HCL 37.5 MG PO TABS: 37.5000 mg | ORAL_TABLET | Freq: Every day | ORAL | 2 refills | Status: DC

## 2017-11-12 MED ORDER — CYANOCOBALAMIN 1000 MCG/ML IJ SOLN: 1000.0000 ug | INTRAMUSCULAR | 1 refills | Status: DC

## 2017-11-16 ENCOUNTER — Ambulatory Visit (INDEPENDENT_AMBULATORY_CARE_PROVIDER_SITE_OTHER): Payer: Managed Care, Other (non HMO) | Admitting: Obstetrics and Gynecology

## 2017-11-16 ENCOUNTER — Encounter: Payer: Self-pay | Admitting: Obstetrics and Gynecology

## 2017-11-16 VITALS — BP 138/84 | HR 111 | Wt 180.5 lb

## 2017-11-16 DIAGNOSIS — E669 Obesity, unspecified: Secondary | ICD-10-CM

## 2017-11-16 NOTE — Progress Notes (Signed)
Pt is here for weight management, we discussed water intake and increasing exercise, she voiced understanding  11/16/17 wt- 180lb

## 2017-12-01 ENCOUNTER — Ambulatory Visit: Payer: Self-pay | Admitting: Emergency Medicine

## 2017-12-01 VITALS — BP 110/70 | HR 75 | Temp 98.5°F | Resp 16

## 2017-12-01 DIAGNOSIS — R112 Nausea with vomiting, unspecified: Secondary | ICD-10-CM

## 2017-12-01 LAB — POCT URINALYSIS DIPSTICK
GLUCOSE UA: NEGATIVE
Leukocytes, UA: NEGATIVE
Nitrite, UA: NEGATIVE
PH UA: 6 (ref 5.0–8.0)
Spec Grav, UA: >=1.03 — AB (ref 1.010–1.025)
Urobilinogen, UA: 4 E.U./dL — AB

## 2017-12-01 LAB — POCT URINE PREGNANCY: Preg Test, Ur: NEGATIVE

## 2017-12-01 MED ORDER — ONDANSETRON 8 MG PO TBDP: 8.0000 mg | ORAL_TABLET | Freq: Three times a day (TID) | ORAL | 0 refills | Status: DC | PRN

## 2017-12-01 NOTE — Progress Notes (Signed)
Subjective. Patient enters with a chief complaint of an episode this morning of nausea and upper abdominal pain followed by one episode of emesis. This morning she had a normal bowel movement. She has been exposed to family members with fever and presumed viral illness. She is on birth control pills but is not sexually active. She has no urinary symptoms. 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 She hasn't had no lower abdominal pain. Objective patient is alert and cooperative and in no distress. Chest clear to auscultation and percussion. Abdominal exam. Bowel sounds are normal. There is mild upper abdominal discomfort without rebound. There is no lower abdominal discomfort. Assessment. Patient has had upper abdominal discomfort with one episode of vomiting. Presumed to be viral related. She was giving warning signs, and red flags regarding a more serious problem. She was given a prescription for Zofran 8 mg #6 tablets to have for nausea. She will be out of work today. She will go to the emergency room for further evaluation if she develops fever or low abdominal discomfort. Plan. Zofran prescription written. Red flags discussed with patient. Out of work today.

## 2017-12-01 NOTE — Addendum Note (Signed)
Addended by: Catha BrowEACON, Faryn Sieg T on: 12/01/2017 02:25 PM   Modules accepted: Orders

## 2017-12-13 ENCOUNTER — Ambulatory Visit: Payer: Managed Care, Other (non HMO) | Admitting: Obstetrics and Gynecology

## 2017-12-15 ENCOUNTER — Encounter: Payer: Managed Care, Other (non HMO) | Admitting: Obstetrics and Gynecology

## 2017-12-22 ENCOUNTER — Other Ambulatory Visit: Payer: Self-pay

## 2017-12-22 ENCOUNTER — Encounter: Payer: Self-pay | Admitting: Family Medicine

## 2017-12-22 ENCOUNTER — Ambulatory Visit: Payer: Managed Care, Other (non HMO) | Admitting: Family Medicine

## 2017-12-22 VITALS — BP 143/89 | HR 86 | Temp 97.8°F | Resp 16 | Wt 171.9 lb

## 2017-12-22 DIAGNOSIS — Z3009 Encounter for other general counseling and advice on contraception: Secondary | ICD-10-CM

## 2017-12-22 DIAGNOSIS — R030 Elevated blood-pressure reading, without diagnosis of hypertension: Secondary | ICD-10-CM | POA: Diagnosis not present

## 2017-12-22 DIAGNOSIS — F419 Anxiety disorder, unspecified: Secondary | ICD-10-CM | POA: Diagnosis not present

## 2017-12-22 NOTE — Progress Notes (Signed)
BP (!) 143/89 (BP Location: Left Arm, Patient Position: Sitting)   Pulse 86   Temp 97.8 F (36.6 C) (Oral)   Resp 16   Wt 171 lb 14.4 oz (78 kg)   BMI 28.61 kg/m    Subjective:    Patient ID: Doris Thomas, female    DOB: 26-Aug-1996, 22 y.o.   MRN: [id number]  HPI: Doris Thomas is a 22 y.o. female  Chief Complaint  Patient presents with  . Anxiety  . Fatigue  . Follow-up    birth control problems    Pt here today to discuss alternative contraception as her oral contraceptives were worsening migraines and anxiety. Stopped her pill several weeks ago. Wanting something that isn't daily. Denies hx of DVTs or fhx of DVT or breast cancer.   Anxiety - first started about a month ago. No known trigger. Coming in waves. Does not want to take anything for it at this time as she does not feel that it is severe at this point.   BPs still consistently high, even when checked at home. This is a new issue for her over the past few months. Denies CP, SOB, palpitations, major dietary changes or new stressors.   Past Medical History:  Diagnosis Date  . Vitamin D deficiency    Social History   Socioeconomic History  . Marital status: Single    Spouse name: Not on file  . Number of children: Not on file  . Years of education: Not on file  . Highest education level: Not on file  Social Needs  . Financial resource strain: Not on file  . Food insecurity - worry: Not on file  . Food insecurity - inability: Not on file  . Transportation needs - medical: Not on file  . Transportation needs - non-medical: Not on file  Occupational History  . Not on file  Tobacco Use  . Smoking status: Never Smoker  . Smokeless tobacco: Never Used  Substance and Sexual Activity  . Alcohol use: No    Frequency: Never  . Drug use: No  . Sexual activity: Yes    Birth control/protection: Pill  Other Topics Concern  . Not on file  Social History Narrative  . Not on file   Relevant past medical,  surgical, family and social history reviewed and updated as indicated. Interim medical history since our last visit reviewed. Allergies and medications reviewed and updated.  Review of Systems  Constitutional: Negative.   HENT: Negative.   Respiratory: Negative.   Cardiovascular: Negative.   Gastrointestinal: Negative.   Genitourinary: Negative.   Musculoskeletal: Negative.   Neurological: Positive for headaches.  Psychiatric/Behavioral: The patient is nervous/anxious.    Per HPI unless specifically indicated above     Objective:    BP (!) 143/89 (BP Location: Left Arm, Patient Position: Sitting)   Pulse 86   Temp 97.8 F (36.6 C) (Oral)   Resp 16   Wt 171 lb 14.4 oz (78 kg)   BMI 28.61 kg/m   Wt Readings from Last 3 Encounters:  12/22/17 171 lb 14.4 oz (78 kg)  11/16/17 180 lb 8 oz (81.9 kg)  11/02/17 174 lb 12.8 oz (79.3 kg)    Physical Exam  Constitutional: She is oriented to person, place, and time. She appears well-developed and well-nourished.  HENT:  Head: Atraumatic.  Eyes: Conjunctivae are normal. Pupils are equal, round, and reactive to light.  Neck: Normal range of motion. Neck supple.  Cardiovascular: Normal rate  and normal heart sounds.  Pulmonary/Chest: Effort normal and breath sounds normal. No respiratory distress.  Musculoskeletal: Normal range of motion.  Neurological: She is alert and oriented to person, place, and time.  Skin: Skin is warm and dry.  Psychiatric: She has a normal mood and affect. Her behavior is normal.  Nursing note and vitals reviewed.   Results for orders placed or performed in visit on 12/01/17  POCT Urinalysis Dipstick (CPT 81002)  Result Value Ref Range   Color, UA Dark Yellow    Clarity, UA Clear    Glucose, UA Negative    Bilirubin, UA 1+    Ketones, UA Trace    Spec Grav, UA >=1.030 (A) 1.010 - 1.025   Blood, UA Trace    pH, UA 6.0 5.0 - 8.0   Protein, UA 1+    Urobilinogen, UA 4.0 (A) 0.2 or 1.0 E.U./dL    Nitrite, UA Negative    Leukocytes, UA Negative Negative   Appearance     Odor    POCT Urine Pregnancy (CPT 81025)  Result Value Ref Range   Preg Test, Ur Negative Negative      Assessment & Plan:   Problem List Items Addressed This Visit    None    Visit Diagnoses    Encounter for other general counseling or advice on contraception    -  Primary   Long discussion about options, pt leaning toward nexplanon. Will schedule with GYN for insertion.    Anxiety       Discussed counseling as pt not ready to implement any medications. Packet given of local counselors. Increase exercise for stress relief   Blood pressure elevated without history of HTN       Will keep a close eye on readings over the next few months, may start low dose BP med soon if no improvement. DASH diet, exercise, stress reduction       Follow up plan: Return for CPE.

## 2017-12-25 NOTE — Patient Instructions (Signed)
Follow up for CPE 

## 2018-01-24 ENCOUNTER — Encounter: Payer: Managed Care, Other (non HMO) | Admitting: Family Medicine

## 2018-01-27 ENCOUNTER — Encounter: Payer: 59 | Admitting: Family Medicine

## 2018-02-24 ENCOUNTER — Ambulatory Visit: Payer: Managed Care, Other (non HMO) | Admitting: Family Medicine

## 2018-02-28 ENCOUNTER — Encounter: Payer: Self-pay | Admitting: Family Medicine

## 2018-03-04 ENCOUNTER — Encounter: Payer: Self-pay | Admitting: Family Medicine

## 2018-03-04 ENCOUNTER — Ambulatory Visit: Payer: Self-pay | Admitting: Family Medicine

## 2018-03-04 VITALS — BP 140/72 | HR 92 | Temp 98.3°F

## 2018-03-04 DIAGNOSIS — N939 Abnormal uterine and vaginal bleeding, unspecified: Secondary | ICD-10-CM

## 2018-03-04 DIAGNOSIS — R319 Hematuria, unspecified: Secondary | ICD-10-CM

## 2018-03-04 LAB — POCT URINALYSIS DIPSTICK
Glucose, UA: NEGATIVE
LEUKOCYTES UA: NEGATIVE
NITRITE UA: NEGATIVE
PH UA: 6 (ref 5.0–8.0)
Spec Grav, UA: >=1.03 — AB (ref 1.010–1.025)
UROBILINOGEN UA: 2 U/dL — AB

## 2018-03-04 LAB — POCT URINE PREGNANCY: Preg Test, Ur: NEGATIVE

## 2018-03-04 NOTE — Progress Notes (Signed)
History of Present Illness   Patient Identification Rutherford GuysKaila A Thomas Thomas is a 22 y.o. female.  Chief Complaint  Spotting after period (1 week after period)  Patient presents today complaining of spotting after her period this month since Sunday.  Reports spotting began 1 week after completion of her menstrual cycle.  Patient reports a regular and predictable menstrual cycle, otherwise unchanged this month.  Patient saw her primary care provider at the end of January to discuss switching contraceptive methods from a combined oral contraceptive to an alternative method that she does not have to take every day.  Patient has been off of birth control for 3 months and is currently using condoms as her only contraceptive method. Patient has not started another form of birth control.  Patient is sexually active.  Denies any concerns for STDs.  The patient denies vaginal symptoms of burning, itching, painful perineum, sores, growths, swollen perineum, perineal odor or painful intercourse.  Describes her vaginal discharge as normal and physiologic. Vaginal bleeding described as spotting.  Patient reports occasional mild abdominal pain located in the lower abdomen, alternating between right and left lower abdomen,  without radiation.  The pain lasts only seconds approx one week after her period.  Severity of symptoms mild. No aggravating or alleviating factors.  Reports noticing a small amount of blood when she urinated last night but is unsure if this is vaginal or urinary source.  Denies dysuria, frequency, anorexia, fever, nausea, vomiting, diarrhea, constipation or diaphoresis.  Denies any relevant history.  Patient reports that recently she has been exercising a lot more.  Review of Systems Pertinent items noted in HPI and remainder of comprehensive ROS otherwise negative.   Objective:    General appearance: alert, cooperative, appears stated age and no distress Back: symmetric, no curvature. ROM normal. No CVA  tenderness. Abdomen: soft, non-tender; bowel sounds normal; no masses,  no organomegaly . Very slight midline lower abdominal tenderness with deep palpation. Pelvic: Unable to perform exam in clinic.  Diagnostic Results:  Urine pregnancy test: Negative Urine dipstick: See result.  Positive findings may be due to concentrated urine sample or increase exercise but needs to be repeated next week at OB/GYN appointment to determine if further workup is necessary.   Assessment:   Vaginal bleeding  Plan:   Due to the mild nature of the patient's symptoms, this being the first occurrence of spotting, inability to do a pelvic examination in our office, and patient's scheduled appointment with OB/GYN on Tuesday - deferring further testing at this time.  Patient educated that repeat urine sample is warranted. Follow-up with OB/GYN and her appointment this upcoming Tuesday (4/2). Red flag symptoms and indications to seek medical care discussed.

## 2018-03-08 ENCOUNTER — Encounter: Payer: Managed Care, Other (non HMO) | Admitting: Obstetrics and Gynecology

## 2018-03-10 ENCOUNTER — Encounter: Payer: Managed Care, Other (non HMO) | Admitting: Obstetrics and Gynecology

## 2018-03-18 ENCOUNTER — Ambulatory Visit: Payer: Managed Care, Other (non HMO) | Admitting: Family Medicine

## 2018-03-18 ENCOUNTER — Encounter: Payer: Self-pay | Admitting: Family Medicine

## 2018-03-18 VITALS — BP 119/72 | HR 97 | Temp 98.4°F | Wt 171.7 lb

## 2018-03-18 DIAGNOSIS — R5383 Other fatigue: Secondary | ICD-10-CM

## 2018-03-18 DIAGNOSIS — Z3009 Encounter for other general counseling and advice on contraception: Secondary | ICD-10-CM | POA: Diagnosis not present

## 2018-03-18 DIAGNOSIS — R8290 Unspecified abnormal findings in urine: Secondary | ICD-10-CM | POA: Diagnosis not present

## 2018-03-18 LAB — PREGNANCY, URINE: Preg Test, Ur: NEGATIVE

## 2018-03-18 MED ORDER — NORETHINDRONE 0.35 MG PO TABS: 1.0000 | ORAL_TABLET | Freq: Every day | ORAL | 11 refills | Status: DC

## 2018-03-18 MED ORDER — VITAMIN D (ERGOCALCIFEROL) 1.25 MG (50000 UNIT) PO CAPS: 50000.0000 [IU] | ORAL_CAPSULE | ORAL | 0 refills | Status: DC

## 2018-03-18 NOTE — Progress Notes (Signed)
BP 119/72 (BP Location: Right Arm, Patient Position: Sitting, Cuff Size: Normal)   Pulse 97   Temp 98.4 F (36.9 C) (Oral)   Wt 171 lb 11.2 oz (77.9 kg)   SpO2 100%   BMI 28.57 kg/m    Subjective:    Patient ID: Doris Thomas, female    DOB: November 20, 1996, 22 y.o.   MRN: [id number]  HPI: Doris Thomas is a 22 y.o. female  Chief Complaint  Patient presents with  . Fatigue    Trouble falling asleep, very tired in mornings. x's 2-3 weeks.   . Contraception    Patient was on [place], patient stopped because of migraines. Wondering if there was [facility].   . Medication Refill    Vitamin D.   Pt here today to discuss fatigue the past few weeks. Difficulty falling asleep at night, does not wake up in the night once asleep. Feels poorly rested. Started a new multivitamin and notices this has been since making that change. No other recent changes, new stressors. Has not tried anything OTC for sleep.   Interested in birth control pills but worried about her hx of migraines with the hormones. Has thought about starting depo, doesn't love the idea of injections but feels that may be safest with her migraines. Periods regular, mild sxs.   Past Medical History:  Diagnosis Date  . Vitamin D deficiency    Social History   Socioeconomic History  . Marital status: Single    Spouse name: Not on file  . Number of children: Not on file  . Years of education: Not on file  . Highest education level: Not on file  Occupational History  . Not on file  Social Needs  . Financial resource strain: Not on file  . Food insecurity:    Worry: Not on file    Inability: Not on file  . Transportation needs:    Medical: Not on file    Non-medical: Not on file  Tobacco Use  . Smoking status: Never Smoker  . Smokeless tobacco: Never Used  Substance and Sexual Activity  . Alcohol use: No    Frequency: Never  . Drug use: No  . Sexual activity: Yes    Birth control/protection: Pill    Lifestyle  . Physical activity:    Days per week: Not on file    Minutes per session: Not on file  . Stress: Not on file  Relationships  . Social connections:    Talks on phone: Not on file    Gets together: Not on file    Attends religious service: Not on file    Active member of club or organization: Not on file    Attends meetings of clubs or organizations: Not on file    Relationship status: Not on file  . Intimate partner violence:    Fear of current or ex partner: Not on file    Emotionally abused: Not on file    Physically abused: Not on file    Forced sexual activity: Not on file  Other Topics Concern  . Not on file  Social History Narrative  . Not on file   Relevant past medical, surgical, family and social history reviewed and updated as indicated. Interim medical history since our last visit reviewed. Allergies and medications reviewed and updated.  Review of Systems  Per HPI unless specifically indicated above     Objective:    BP 119/72 (BP Location: Right  Arm, Patient Position: Sitting, Cuff Size: Normal)   Pulse 97   Temp 98.4 F (36.9 C) (Oral)   Wt 171 lb 11.2 oz (77.9 kg)   SpO2 100%   BMI 28.57 kg/m   Wt Readings from Last 3 Encounters:  03/18/18 171 lb 11.2 oz (77.9 kg)  12/22/17 171 lb 14.4 oz (78 kg)  11/16/17 180 lb 8 oz (81.9 kg)    Physical Exam  Constitutional: She is oriented to person, place, and time. She appears well-developed and well-nourished.  HENT:  Head: Atraumatic.  Eyes: Pupils are equal, round, and reactive to light. Conjunctivae and EOM are normal.  Neck: Normal range of motion. Neck supple.  Cardiovascular: Normal rate and regular rhythm.  Pulmonary/Chest: Effort normal and breath sounds normal.  Abdominal: Soft. Bowel sounds are normal.  Musculoskeletal: Normal range of motion.  Lymphadenopathy:    She has no cervical adenopathy.  Neurological: She is alert and oriented to person, place, and time.  Skin: Skin is  warm and dry.  Psychiatric: She has a normal mood and affect. Her behavior is normal.  Nursing note and vitals reviewed.   Results for orders placed or performed in visit on 03/18/18  Comprehensive metabolic panel  Result Value Ref Range   Glucose 85 65 - 99 mg/dL   BUN 8 6 - 20 mg/dL   Creatinine, Ser 1.61 0.57 - 1.00 mg/dL   GFR calc non Af Amer 120 >59 mL/min/1.73   GFR calc Af Amer 139 >59 mL/min/1.73   BUN/Creatinine Ratio 11 9 - 23   Sodium 138 134 - 144 mmol/L   Potassium 4.4 3.5 - 5.2 mmol/L   Chloride 101 96 - 106 mmol/L   CO2 21 20 - 29 mmol/L   Calcium 9.4 8.7 - 10.2 mg/dL   Total Protein 7.7 6.0 - 8.5 g/dL   Albumin 4.3 3.5 - 5.5 g/dL   Globulin, Total 3.4 1.5 - 4.5 g/dL   Albumin/Globulin Ratio 1.3 1.2 - 2.2   Bilirubin Total 0.3 0.0 - 1.2 mg/dL   Alkaline Phosphatase 74 39 - 117 IU/L   AST 15 0 - 40 IU/L   ALT 12 0 - 32 IU/L  Pregnancy, urine  Result Value Ref Range   Preg Test, Ur Negative Negative      Assessment & Plan:   Problem List Items Addressed This Visit    None    Visit Diagnoses    Fatigue, unspecified type    -  Primary   Reasonable to assume related to some new insomnia. Recommended trying melatonin and/or unisom prn and working on sleep hygiene. Continue to monitor   Abnormal finding on urinalysis       Pt concerned about mulitple U/As recently with urobilinogen positive - wanting further evaluation. Will check CMP today and go from there   Relevant Orders   Comprehensive metabolic panel (Completed)   Encounter for counseling regarding contraception       Discussed multiple options with pt, who is opting to start the progesterone only pill. Risks and side effects reviewed, urine preg negative today   Relevant Orders   Pregnancy, urine (Completed)       Follow up plan: Return in about 1 year (around 03/19/2019) for birth control follow up.

## 2018-03-19 LAB — COMPREHENSIVE METABOLIC PANEL
A/G RATIO: 1.3 (ref 1.2–2.2)
ALT: 12 IU/L (ref 0–32)
AST: 15 IU/L (ref 0–40)
Albumin: 4.3 g/dL (ref 3.5–5.5)
Alkaline Phosphatase: 74 IU/L (ref 39–117)
BUN/Creatinine Ratio: 11 (ref 9–23)
BUN: 8 mg/dL (ref 6–20)
Bilirubin Total: 0.3 mg/dL (ref 0.0–1.2)
CO2: 21 mmol/L (ref 20–29)
Calcium: 9.4 mg/dL (ref 8.7–10.2)
Chloride: 101 mmol/L (ref 96–106)
Creatinine, Ser: 0.71 mg/dL (ref 0.57–1.00)
GFR calc Af Amer: 139 mL/min/{1.73_m2} (ref >59)
GFR, EST NON AFRICAN AMERICAN: 120 mL/min/{1.73_m2} (ref >59)
GLUCOSE: 85 mg/dL (ref 65–99)
Globulin, Total: 3.4 g/dL (ref 1.5–4.5)
POTASSIUM: 4.4 mmol/L (ref 3.5–5.2)
Sodium: 138 mmol/L (ref 134–144)
TOTAL PROTEIN: 7.7 g/dL (ref 6.0–8.5)

## 2018-03-21 ENCOUNTER — Encounter: Payer: Self-pay | Admitting: Family Medicine

## 2018-03-21 ENCOUNTER — Encounter: Payer: Managed Care, Other (non HMO) | Admitting: Family Medicine

## 2018-03-21 NOTE — Patient Instructions (Signed)
Follow up as needed

## 2018-03-28 ENCOUNTER — Telehealth: Payer: Self-pay

## 2018-03-28 MED ORDER — TRAZODONE HCL 50 MG PO TABS: 25.0000 mg | ORAL_TABLET | Freq: Every evening | ORAL | 0 refills | Status: DC | PRN

## 2018-03-28 NOTE — Telephone Encounter (Signed)
Routing back to StevinsonRachel to discuss the issues for patient. If you'd rather me call, I will. Just give me information to what to tell patient.

## 2018-03-28 NOTE — Telephone Encounter (Signed)
Routing to provider to advise.  

## 2018-03-28 NOTE — Telephone Encounter (Signed)
Letter created and draft sent to [facility] to review.

## 2018-03-28 NOTE — Telephone Encounter (Signed)
Called pt and left VM to discuss her continued issues with sleep. OK to generate work note requested but asked her to call back to discuss some options for management

## 2018-03-28 NOTE — Telephone Encounter (Signed)
Copied from CRM [id number]. Topic: General - Other >> Mar 25, 2018  1:42 PM Gerrianne ScalePayne, Angela L wrote: Reason for CRM: patient calling needing a note for out of work due to her having problems sleeping she had missed April 9th 13th and 14th  she came into the office on 03-18-18 and said something to the provider and advised pt to get OTC medicine to help her sleep she states that she had did that but when she woke up to go to work at 5:45 in the mornings it was making her groggy she states that the medicine OTC did help but always woke up sleepy call pt when note is ready she will come to office to pick it up

## 2018-03-28 NOTE — Telephone Encounter (Signed)
Returned pt's call, she states the OTC options she's tried have not worked well with her sleep/work schedule and now she's not getting any sleep at all. Agreeable to trying low dose trazodone for several weeks to see if that helps in a gentle way. Pt to f/u via phone or mychart with progress.   OK to send work excuse via [facility]istmychart

## 2018-03-28 NOTE — Telephone Encounter (Signed)
Letter sent via my chart

## 2018-03-28 NOTE — Telephone Encounter (Signed)
Patient called returning office missed call. Please call patient back.

## 2018-05-13 ENCOUNTER — Encounter: Payer: Self-pay | Admitting: Unknown Physician Specialty

## 2018-05-13 ENCOUNTER — Encounter: Payer: Self-pay | Admitting: Family Medicine

## 2018-05-13 ENCOUNTER — Ambulatory Visit: Payer: Managed Care, Other (non HMO) | Admitting: Unknown Physician Specialty

## 2018-05-13 VITALS — BP 128/87 | HR 75 | Temp 98.6°F | Wt 167.6 lb

## 2018-05-13 DIAGNOSIS — J029 Acute pharyngitis, unspecified: Secondary | ICD-10-CM

## 2018-05-13 DIAGNOSIS — J069 Acute upper respiratory infection, unspecified: Secondary | ICD-10-CM

## 2018-05-13 LAB — RAPID STREP SCREEN (MED CTR MEBANE ONLY): STREP GP A AG, IA W/REFLEX: NEGATIVE

## 2018-05-13 LAB — CULTURE, GROUP A STREP

## 2018-05-13 NOTE — Progress Notes (Signed)
BP 128/87   Pulse 75   Temp 98.6 F (37 C) (Oral)   Wt 167 lb 9.6 oz (76 kg)   SpO2 100%   BMI 27.89 kg/m    Subjective:    Patient ID: Doris Thomas, female    DOB: [birth year], 22 y.o.   MRN: [id number]  HPI: Doris Thomas is a 22 y.o. female  Chief Complaint  Patient presents with  . URI    pt states she has had congestion, sore throat, headach, and sinus pressure for 2 days    URI   This is a new problem. Episode onset: 2 days. The problem has been unchanged. There has been no fever. Associated symptoms include congestion, coughing, headaches, rhinorrhea and a sore throat. Pertinent negatives include no abdominal pain, chest pain, diarrhea, dysuria, ear pain, joint pain, joint swelling, nausea, neck pain, plugged ear sensation, rash, sinus pain, sneezing, swollen glands, vomiting or wheezing. She has tried nothing for the symptoms.   Relevant past medical, surgical, family and social history reviewed and updated as indicated. Interim medical history since our last visit reviewed. Allergies and medications reviewed and updated.  Review of Systems  HENT: Positive for congestion, rhinorrhea and sore throat. Negative for ear pain, sinus pain and sneezing.   Respiratory: Positive for cough. Negative for wheezing.   Cardiovascular: Negative for chest pain.  Gastrointestinal: Negative for abdominal pain, diarrhea, nausea and vomiting.  Genitourinary: Negative for dysuria.  Musculoskeletal: Negative for joint pain and neck pain.  Skin: Negative for rash.  Neurological: Positive for headaches.    Per HPI unless specifically indicated above     Objective:    BP 128/87   Pulse 75   Temp 98.6 F (37 C) (Oral)   Wt 167 lb 9.6 oz (76 kg)   SpO2 100%   BMI 27.89 kg/m   Wt Readings from Last 3 Encounters:  05/13/18 167 lb 9.6 oz (76 kg)  03/18/18 171 lb 11.2 oz (77.9 kg)  12/22/17 171 lb 14.4 oz (78 kg)    Physical Exam  Constitutional: She is oriented to person, place,  and time. She appears well-developed and well-nourished. No distress.  HENT:  Head: Normocephalic and atraumatic.  Right Ear: Tympanic membrane and ear canal normal.  Left Ear: Tympanic membrane and ear canal normal.  Nose: Rhinorrhea present. Right sinus exhibits no maxillary sinus tenderness and no frontal sinus tenderness. Left sinus exhibits no maxillary sinus tenderness and no frontal sinus tenderness.  Mouth/Throat: Mucous membranes are normal. Posterior oropharyngeal erythema present.  Eyes: Conjunctivae and lids are normal. Right eye exhibits no discharge. Left eye exhibits no discharge. No scleral icterus.  Cardiovascular: Normal rate and regular rhythm.  Pulmonary/Chest: Effort normal and breath sounds normal. No respiratory distress.  Abdominal: Normal appearance. There is no splenomegaly or hepatomegaly.  Musculoskeletal: Normal range of motion.  Neurological: She is alert and oriented to person, place, and time.  Skin: Skin is intact. No rash noted. No pallor.  Psychiatric: She has a normal mood and affect. Her behavior is normal. Judgment and thought content normal.    Results for orders placed or performed in visit on 03/18/18  Comprehensive metabolic panel  Result Value Ref Range   Glucose 85 65 - 99 mg/dL   BUN 8 6 - 20 mg/dL   Creatinine, Ser 7.250.71 0.57 - 1.00 mg/dL   GFR calc non Af Amer 120 >59 mL/min/1.73   GFR calc Af Amer 139 >59 mL/min/1.73   BUN/Creatinine Ratio  11 9 - 23   Sodium 138 134 - 144 mmol/L   Potassium 4.4 3.5 - 5.2 mmol/L   Chloride 101 96 - 106 mmol/L   CO2 21 20 - 29 mmol/L   Calcium 9.4 8.7 - 10.2 mg/dL   Total Protein 7.7 6.0 - 8.5 g/dL   Albumin 4.3 3.5 - 5.5 g/dL   Globulin, Total 3.4 1.5 - 4.5 g/dL   Albumin/Globulin Ratio 1.3 1.2 - 2.2   Bilirubin Total 0.3 0.0 - 1.2 mg/dL   Alkaline Phosphatase 74 39 - 117 IU/L   AST 15 0 - 40 IU/L   ALT 12 0 - 32 IU/L  Pregnancy, urine  Result Value Ref Range   Preg Test, Ur Negative Negative       Assessment & Plan:   Problem List Items Addressed This Visit    None    Visit Diagnoses    Sore throat    -  Primary   Relevant Orders   Rapid Strep Screen ([facility] & [facility] ONLY)   Culture, Group A Strep   Upper respiratory tract infection, unspecified type       Discussed this is a viral illness which usually runs it's course within a 2 weeks period of time.  Supportive care with rest, fluids, saline nasal rinses      Discussed OTC Nyquil for night-time and pseudophed with Mucinex during the day.    Follow up plan: Return if symptoms worsen or fail to improve.

## 2018-05-16 LAB — CULTURE, GROUP A STREP: Strep A Culture: NEGATIVE

## 2018-06-12 ENCOUNTER — Encounter: Payer: Self-pay | Admitting: Obstetrics and Gynecology

## 2018-06-15 ENCOUNTER — Encounter: Payer: Self-pay | Admitting: Obstetrics and Gynecology

## 2018-06-15 ENCOUNTER — Ambulatory Visit: Payer: Managed Care, Other (non HMO) | Admitting: Obstetrics and Gynecology

## 2018-06-15 VITALS — BP 124/84 | HR 98 | Ht 65.0 in | Wt 170.7 lb

## 2018-06-15 DIAGNOSIS — Z6828 Body mass index (BMI) 28.0-28.9, adult: Secondary | ICD-10-CM | POA: Diagnosis not present

## 2018-06-15 DIAGNOSIS — E663 Overweight: Secondary | ICD-10-CM

## 2018-06-15 DIAGNOSIS — L709 Acne, unspecified: Secondary | ICD-10-CM

## 2018-06-15 MED ORDER — PHENTERMINE HCL 37.5 MG PO TABS: 37.5000 mg | ORAL_TABLET | Freq: Every day | ORAL | 2 refills | Status: DC

## 2018-06-15 MED ORDER — CYANOCOBALAMIN 1000 MCG/ML IJ SOLN: 1000.0000 ug | INTRAMUSCULAR | 3 refills | Status: DC

## 2018-06-15 MED ORDER — CYANOCOBALAMIN 1000 MCG/ML IJ SOLN: 1000.0000 ug | INTRAMUSCULAR | 1 refills | Status: DC

## 2018-06-15 NOTE — Progress Notes (Signed)
Subjective:  Doris Thomas is a 22 y.o. G0P0000 at Unknown being seen today for weight loss management- initial visit.  Patient reports General ROS: negative and reports previous weight loss attempts: was successful with or program here in 2017 and desires restart. Is currently exercising 3 days a week a gym and drinking 2 large bottles of water daily.  Previous/Current treatment includes: small frequent feedings,  Past evaluation has included: metabolic profile, hemoglobin A1c, thyroid panel  and psychiatric evaluation.  Past treatment has included: small frequent feedings, nutritional supplement, vitamin supplement,  vitamin B-12 injections, appetite suppresant.  The following portions of the patient's history were reviewed and updated as appropriate: allergies, current medications, past family history, past medical history, past social history, past surgical history and problem list.   Objective:   Vitals:   06/15/18 0953  BP: 124/84  Pulse: 98  Weight: 170 lb 11.2 oz (77.4 kg)  Height: 5\' 5"  (1.651 m)    General:  Alert, oriented and cooperative. Patient is in no acute distress.  :   :   :   :   :   :   PE: Well groomed female in no current distress,   Mental Status: Normal mood and affect. Normal behavior. Normal judgment and thought content.  Skin- no worrisome moles noted, mild acne on face.  Current BMI: Body mass index is 28.41 kg/m.   Assessment and Plan:  Obesity  1. Overweight (BMI 25.0-29.9) To increas exercise to 4-5 days a week and increase water intake to 4-5 large bottles a day.  2. Acne, unspecified acne type desires establising care with dermatologist- referral placed.   Plan: low carb, High protein diet RX for adipex 37.5 mg daily and B12 1000mcg.ml monthly, to start now with first injection given at today's visit. Reviewed side-effects common to both medications and expected outcomes. Increase daily water intake to at least 8 bottle a day, every  day.  Goal is to reduse weight by 10% by end of three months, and will re-evaluate then.  RTC in 4 weeks for Nurse visit to check weight & BP, and get next B12 injections.    Please refer to After Visit Summary for other counseling recommendations.    HalaulaShambley, Jovanne Riggenbach N, CNM   Calieb Lichtman Nelson LagoonN Avonda Toso, CNM      Consider the Low Glycemic Index Diet and 6 smaller meals daily .  This boosts your metabolism and regulates your sugars:   Use the protein bar by Atkins because they have lots of fiber in them  Find the low carb flatbreads, tortillas and pita breads for sandwiches:  Joseph's makes a pita bread and a flat bread , available at [facility] and [employer]; Toufayah makes a low carb flatbread available at [employer] and HT that is 9 net carbs and 100 cal Mission makes a low carb whole wheat tortilla available at [employer],and most grocery stores with 6 net carbs and 210 cal  [place] yogurt can still have a lot of carbs .  Dannon Light N fit has 80 cal and 8 carbs

## 2018-07-08 ENCOUNTER — Encounter: Payer: Self-pay | Admitting: Obstetrics and Gynecology

## 2018-07-22 ENCOUNTER — Ambulatory Visit: Payer: Self-pay

## 2018-08-10 ENCOUNTER — Encounter: Payer: Self-pay | Admitting: Family Medicine

## 2018-08-10 ENCOUNTER — Other Ambulatory Visit: Payer: Self-pay

## 2018-08-10 ENCOUNTER — Ambulatory Visit: Payer: Managed Care, Other (non HMO) | Admitting: Family Medicine

## 2018-08-10 VITALS — BP 138/85 | HR 105 | Temp 97.5°F | Ht 65.0 in | Wt 161.5 lb

## 2018-08-10 DIAGNOSIS — R5383 Other fatigue: Secondary | ICD-10-CM

## 2018-08-10 DIAGNOSIS — Z3009 Encounter for other general counseling and advice on contraception: Secondary | ICD-10-CM | POA: Diagnosis not present

## 2018-08-10 DIAGNOSIS — R112 Nausea with vomiting, unspecified: Secondary | ICD-10-CM

## 2018-08-10 NOTE — Patient Instructions (Addendum)
Medroxyprogesterone injection [Contraceptive] What is this medicine? MEDROXYPROGESTERONE (me DROX ee proe JES te rone) contraceptive injections prevent pregnancy. They provide effective birth control for 3 months. Depo-subQ Provera 104 is also used for treating pain related to endometriosis. This medicine may be used for other purposes; ask your health care provider or pharmacist if you have questions. COMMON BRAND NAME(S): Depo-Provera, Depo-subQ Provera 104 What should I tell my health care provider before I take this medicine? They need to know if you have any of these conditions: -frequently drink alcohol -asthma -blood vessel disease or a history of a blood clot in the lungs or legs -bone disease such as osteoporosis -breast cancer -diabetes -eating disorder (anorexia nervosa or bulimia) -high blood pressure -HIV infection or AIDS -kidney disease -liver disease -mental depression -migraine -seizures (convulsions) -stroke -tobacco smoker -vaginal bleeding -an unusual or allergic reaction to medroxyprogesterone, other hormones, medicines, foods, dyes, or preservatives -pregnant or trying to get pregnant -breast-feeding How should I use this medicine? Depo-Provera Contraceptive injection is given into a muscle. Depo-subQ Provera 104 injection is given under the skin. These injections are given by a health care professional. You must not be pregnant before getting an injection. The injection is usually given during the first 5 days after the start of a menstrual period or 6 weeks after delivery of a baby. Talk to your pediatrician regarding the use of this medicine in children. Special care may be needed. These injections have been used in female children who have started having menstrual periods. Overdosage: If you think you have taken too much of this medicine contact a poison control center or emergency room at once. NOTE: This medicine is only for you. Do not share this  medicine with others. What if I miss a dose? Try not to miss a dose. You must get an injection once every 3 months to maintain birth control. If you cannot keep an appointment, call and reschedule it. If you wait longer than 13 weeks between Depo-Provera contraceptive injections or longer than 14 weeks between Depo-subQ Provera 104 injections, you could get pregnant. Use another method for birth control if you miss your appointment. You may also need a pregnancy test before receiving another injection. What may interact with this medicine? Do not take this medicine with any of the following medications: -bosentan This medicine may also interact with the following medications: -aminoglutethimide -antibiotics or medicines for infections, especially rifampin, rifabutin, rifapentine, and griseofulvin -aprepitant -barbiturate medicines such as phenobarbital or primidone -bexarotene -carbamazepine -medicines for seizures like ethotoin, felbamate, oxcarbazepine, phenytoin, topiramate -modafinil -St. John's wort This list may not describe all possible interactions. Give your health care provider a list of all the medicines, herbs, non-prescription drugs, or dietary supplements you use. Also tell them if you smoke, drink alcohol, or use illegal drugs. Some items may interact with your medicine. What should I watch for while using this medicine? This drug does not protect you against HIV infection (AIDS) or other sexually transmitted diseases. Use of this product may cause you to lose calcium from your bones. Loss of calcium may cause weak bones (osteoporosis). Only use this product for more than 2 years if other forms of birth control are not right for you. The longer you use this product for birth control the more likely you will be at risk for weak bones. Ask your health care professional how you can keep strong bones. You may have a change in bleeding pattern or irregular periods. Many females stop  having periods while taking this drug. If you have received your injections on time, your chance of being pregnant is very low. If you think you may be pregnant, see your health care professional as soon as possible. Tell your health care professional if you want to get pregnant within the next year. The effect of this medicine may last a long time after you get your last injection. What side effects may I notice from receiving this medicine? Side effects that you should report to your doctor or health care professional as soon as possible: -allergic reactions like skin rash, itching or hives, swelling of the face, lips, or tongue -breast tenderness or discharge -breathing problems -changes in vision -depression -feeling faint or lightheaded, falls -fever -pain in the abdomen, chest, groin, or leg -problems with balance, talking, walking -unusually weak or tired -yellowing of the eyes or skin Side effects that usually do not require medical attention (report to your doctor or health care professional if they continue or are bothersome): -acne -fluid retention and swelling -headache -irregular periods, spotting, or absent periods -temporary pain, itching, or skin reaction at site where injected -weight gain This list may not describe all possible side effects. Call your doctor for medical advice about side effects. You may report side effects to FDA at 1-800-FDA-1088. Where should I keep my medicine? This does not apply. The injection will be given to you by a health care professional. NOTE: This sheet is a summary. It may not cover all possible information. If you have questions about this medicine, talk to your doctor, pharmacist, or health care provider.  2018 [employer] Standard (2008-12-14 18:37:56) Medroxyprogesterone injection [Contraceptive] What is this medicine? MEDROXYPROGESTERONE (me DROX ee proe JES te rone) contraceptive injections prevent pregnancy. They provide  effective birth control for 3 months. Depo-subQ Provera 104 is also used for treating pain related to endometriosis. This medicine may be used for other purposes; ask your health care provider or pharmacist if you have questions. COMMON BRAND NAME(S): Depo-Provera, Depo-subQ Provera 104 What should I tell my health care provider before I take this medicine? They need to know if you have any of these conditions: -frequently drink alcohol -asthma -blood vessel disease or a history of a blood clot in the lungs or legs -bone disease such as osteoporosis -breast cancer -diabetes -eating disorder (anorexia nervosa or bulimia) -high blood pressure -HIV infection or AIDS -kidney disease -liver disease -mental depression -migraine -seizures (convulsions) -stroke -tobacco smoker -vaginal bleeding -an unusual or allergic reaction to medroxyprogesterone, other hormones, medicines, foods, dyes, or preservatives -pregnant or trying to get pregnant -breast-feeding How should I use this medicine? Depo-Provera Contraceptive injection is given into a muscle. Depo-subQ Provera 104 injection is given under the skin. These injections are given by a health care professional. You must not be pregnant before getting an injection. The injection is usually given during the first 5 days after the start of a menstrual period or 6 weeks after delivery of a baby. Talk to your pediatrician regarding the use of this medicine in children. Special care may be needed. These injections have been used in female children who have started having menstrual periods. Overdosage: If you think you have taken too much of this medicine contact a poison control center or emergency room at once. NOTE: This medicine is only for you. Do not share this medicine with others. What if I miss a dose? Try not to miss a dose. You must get an injection once every 3 months  to maintain birth control. If you cannot keep an appointment, call and  reschedule it. If you wait longer than 13 weeks between Depo-Provera contraceptive injections or longer than 14 weeks between Depo-subQ Provera 104 injections, you could get pregnant. Use another method for birth control if you miss your appointment. You may also need a pregnancy test before receiving another injection. What may interact with this medicine? Do not take this medicine with any of the following medications: -bosentan This medicine may also interact with the following medications: -aminoglutethimide -antibiotics or medicines for infections, especially rifampin, rifabutin, rifapentine, and griseofulvin -aprepitant -barbiturate medicines such as phenobarbital or primidone -bexarotene -carbamazepine -medicines for seizures like ethotoin, felbamate, oxcarbazepine, phenytoin, topiramate -modafinil -St. John's wort This list may not describe all possible interactions. Give your health care provider a list of all the medicines, herbs, non-prescription drugs, or dietary supplements you use. Also tell them if you smoke, drink alcohol, or use illegal drugs. Some items may interact with your medicine. What should I watch for while using this medicine? This drug does not protect you against HIV infection (AIDS) or other sexually transmitted diseases. Use of this product may cause you to lose calcium from your bones. Loss of calcium may cause weak bones (osteoporosis). Only use this product for more than 2 years if other forms of birth control are not right for you. The longer you use this product for birth control the more likely you will be at risk for weak bones. Ask your health care professional how you can keep strong bones. You may have a change in bleeding pattern or irregular periods. Many females stop having periods while taking this drug. If you have received your injections on time, your chance of being pregnant is very low. If you think you may be pregnant, see your health care  professional as soon as possible. Tell your health care professional if you want to get pregnant within the next year. The effect of this medicine may last a long time after you get your last injection. What side effects may I notice from receiving this medicine? Side effects that you should report to your doctor or health care professional as soon as possible: -allergic reactions like skin rash, itching or hives, swelling of the face, lips, or tongue -breast tenderness or discharge -breathing problems -changes in vision -depression -feeling faint or lightheaded, falls -fever -pain in the abdomen, chest, groin, or leg -problems with balance, talking, walking -unusually weak or tired -yellowing of the eyes or skin Side effects that usually do not require medical attention (report to your doctor or health care professional if they continue or are bothersome): -acne -fluid retention and swelling -headache -irregular periods, spotting, or absent periods -temporary pain, itching, or skin reaction at site where injected -weight gain This list may not describe all possible side effects. Call your doctor for medical advice about side effects. You may report side effects to FDA at 1-800-FDA-1088. Where should I keep my medicine? This does not apply. The injection will be given to you by a health care professional. NOTE: This sheet is a summary. It may not cover all possible information. If you have questions about this medicine, talk to your doctor, pharmacist, or health care provider.  2018 [employer]/Gold Standard (2008-12-14 18:37:56) Medroxyprogesterone injection [Contraceptive] What is this medicine? MEDROXYPROGESTERONE (me DROX ee proe JES te rone) contraceptive injections prevent pregnancy. They provide effective birth control for 3 months. Depo-subQ Provera 104 is also used for treating pain related to  endometriosis. This medicine may be used for other purposes; ask your health care  provider or pharmacist if you have questions. COMMON BRAND NAME(S): Depo-Provera, Depo-subQ Provera 104 What should I tell my health care provider before I take this medicine? They need to know if you have any of these conditions: -frequently drink alcohol -asthma -blood vessel disease or a history of a blood clot in the lungs or legs -bone disease such as osteoporosis -breast cancer -diabetes -eating disorder (anorexia nervosa or bulimia) -high blood pressure -HIV infection or AIDS -kidney disease -liver disease -mental depression -migraine -seizures (convulsions) -stroke -tobacco smoker -vaginal bleeding -an unusual or allergic reaction to medroxyprogesterone, other hormones, medicines, foods, dyes, or preservatives -pregnant or trying to get pregnant -breast-feeding How should I use this medicine? Depo-Provera Contraceptive injection is given into a muscle. Depo-subQ Provera 104 injection is given under the skin. These injections are given by a health care professional. You must not be pregnant before getting an injection. The injection is usually given during the first 5 days after the start of a menstrual period or 6 weeks after delivery of a baby. Talk to your pediatrician regarding the use of this medicine in children. Special care may be needed. These injections have been used in female children who have started having menstrual periods. Overdosage: If you think you have taken too much of this medicine contact a poison control center or emergency room at once. NOTE: This medicine is only for you. Do not share this medicine with others. What if I miss a dose? Try not to miss a dose. You must get an injection once every 3 months to maintain birth control. If you cannot keep an appointment, call and reschedule it. If you wait longer than 13 weeks between Depo-Provera contraceptive injections or longer than 14 weeks between Depo-subQ Provera 104 injections, you could get pregnant.  Use another method for birth control if you miss your appointment. You may also need a pregnancy test before receiving another injection. What may interact with this medicine? Do not take this medicine with any of the following medications: -bosentan This medicine may also interact with the following medications: -aminoglutethimide -antibiotics or medicines for infections, especially rifampin, rifabutin, rifapentine, and griseofulvin -aprepitant -barbiturate medicines such as phenobarbital or primidone -bexarotene -carbamazepine -medicines for seizures like ethotoin, felbamate, oxcarbazepine, phenytoin, topiramate -modafinil -St. John's wort This list may not describe all possible interactions. Give your health care provider a list of all the medicines, herbs, non-prescription drugs, or dietary supplements you use. Also tell them if you smoke, drink alcohol, or use illegal drugs. Some items may interact with your medicine. What should I watch for while using this medicine? This drug does not protect you against HIV infection (AIDS) or other sexually transmitted diseases. Use of this product may cause you to lose calcium from your bones. Loss of calcium may cause weak bones (osteoporosis). Only use this product for more than 2 years if other forms of birth control are not right for you. The longer you use this product for birth control the more likely you will be at risk for weak bones. Ask your health care professional how you can keep strong bones. You may have a change in bleeding pattern or irregular periods. Many females stop having periods while taking this drug. If you have received your injections on time, your chance of being pregnant is very low. If you think you may be pregnant, see your health care professional as soon as possible. Tell  your health care professional if you want to get pregnant within the next year. The effect of this medicine may last a long time after you get your  last injection. What side effects may I notice from receiving this medicine? Side effects that you should report to your doctor or health care professional as soon as possible: -allergic reactions like skin rash, itching or hives, swelling of the face, lips, or tongue -breast tenderness or discharge -breathing problems -changes in vision -depression -feeling faint or lightheaded, falls -fever -pain in the abdomen, chest, groin, or leg -problems with balance, talking, walking -unusually weak or tired -yellowing of the eyes or skin Side effects that usually do not require medical attention (report to your doctor or health care professional if they continue or are bothersome): -acne -fluid retention and swelling -headache -irregular periods, spotting, or absent periods -temporary pain, itching, or skin reaction at site where injected -weight gain This list may not describe all possible side effects. Call your doctor for medical advice about side effects. You may report side effects to FDA at 1-800-FDA-1088. Where should I keep my medicine? This does not apply. The injection will be given to you by a health care professional. NOTE: This sheet is a summary. It may not cover all possible information. If you have questions about this medicine, talk to your doctor, pharmacist, or health care provider.  2018 [employer]/Gold Standard (2008-12-14 18:37:56) Ethinyl Estradiol; Etonogestrel vaginal ring What is this medicine? ETHINYL ESTRADIOL; ETONOGESTREL (ETH in il es tra DYE ole; et oh noe JES trel) vaginal ring is a flexible, vaginal ring used as a contraceptive (birth control method). This medicine combines two types of female hormones, an estrogen and a progestin. This ring is used to prevent ovulation and pregnancy. Each ring is effective for one month. This medicine may be used for other purposes; ask your health care provider or pharmacist if you have questions. COMMON BRAND NAME(S):  NuvaRing What should I tell my health care provider before I take this medicine? They need to know if you have or ever had any of these conditions: -abnormal vaginal bleeding -blood vessel disease or blood clots -breast, cervical, endometrial, ovarian, liver, or uterine cancer -diabetes -gallbladder disease -heart disease or recent heart attack -high blood pressure -high cholesterol -kidney disease -liver disease -migraine headaches -stroke -systemic lupus erythematosus (SLE) -tobacco smoker -an unusual or allergic reaction to estrogens, progestins, other medicines, foods, dyes, or preservatives -pregnant or trying to get pregnant -breast-feeding How should I use this medicine? Insert the ring into your vagina as directed. Follow the directions on the prescription label. The ring will remain place for 3 weeks and is then removed for a 1-week break. A new ring is inserted 1 week after the last ring was removed, on the same day of the week. Check often to make sure the ring is still in place, especially before and after sexual intercourse. If the ring was out of the vagina for an unknown amount of time, you may not be protected from pregnancy. Perform a pregnancy test and call your doctor. Do not use more often than directed. A patient package insert for the product will be given with each prescription and refill. Read this sheet carefully each time. The sheet may change frequently. Contact your pediatrician regarding the use of this medicine in children. Special care may be needed. This medicine has been used in female children who have started having menstrual periods. Overdosage: If you think you have taken too  much of this medicine contact a poison control center or emergency room at once. NOTE: This medicine is only for you. Do not share this medicine with others. What if I miss a dose? You will need to replace your vaginal ring once a month as directed. If the ring should slip out, or  if you leave it in longer or shorter than you should, contact your health care professional for advice. What may interact with this medicine? Do not take this medicine with the following medication: -dasabuvir; ombitasvir; paritaprevir; ritonavir -ombitasvir; paritaprevir; ritonavir This medicine may also interact with the following medications: -acetaminophen -antibiotics or medicines for infections, especially rifampin, rifabutin, rifapentine, and griseofulvin, and possibly penicillins or tetracyclines -aprepitant -ascorbic acid (vitamin C) -atorvastatin -barbiturate medicines, such as phenobarbital -bosentan -carbamazepine -caffeine -clofibrate -cyclosporine -dantrolene -doxercalciferol -felbamate -grapefruit juice -hydrocortisone -medicines for anxiety or sleeping problems, such as diazepam or temazepam -medicines for diabetes, including pioglitazone -modafinil -mycophenolate -nefazodone -oxcarbazepine -phenytoin -prednisolone -ritonavir or other medicines for HIV infection or AIDS -rosuvastatin -selegiline -soy isoflavones supplements -St. John's wort -tamoxifen or raloxifene -theophylline -thyroid hormones -topiramate -warfarin This list may not describe all possible interactions. Give your health care provider a list of all the medicines, herbs, non-prescription drugs, or dietary supplements you use. Also tell them if you smoke, drink alcohol, or use illegal drugs. Some items may interact with your medicine. What should I watch for while using this medicine? Visit your doctor or health care professional for regular checks on your progress. You will need a regular breast and pelvic exam and Pap smear while on this medicine. Use an additional method of contraception during the first cycle that you use this ring. Do not use a diaphragm or female condom, as the ring can interfere with these birth control methods and their proper placement. If you have any reason to  think you are pregnant, stop using this medicine right away and contact your doctor or health care professional. If you are using this medicine for hormone related problems, it may take several cycles of use to see improvement in your condition. Smoking increases the risk of getting a blood clot or having a stroke while you are using hormonal birth control, especially if you are more than 23 years old. You are strongly advised not to smoke. This medicine can make your body retain fluid, making your fingers, hands, or ankles swell. Your blood pressure can go up. Contact your doctor or health care professional if you feel you are retaining fluid. This medicine can make you more sensitive to the sun. Keep out of the sun. If you cannot avoid being in the sun, wear protective clothing and use sunscreen. Do not use sun lamps or tanning beds/booths. If you wear contact lenses and notice visual changes, or if the lenses begin to feel uncomfortable, consult your eye care specialist. In some women, tenderness, swelling, or minor bleeding of the gums may occur. Notify your dentist if this happens. Brushing and flossing your teeth regularly may help limit this. See your dentist regularly and inform your dentist of the medicines you are taking. If you are going to have elective surgery, you may need to stop using this medicine before the surgery. Consult your health care professional for advice. This medicine does not protect you against HIV infection (AIDS) or any other sexually transmitted diseases. What side effects may I notice from receiving this medicine? Side effects that you should report to your doctor or health care professional as soon as  possible: -breast tissue changes or discharge -changes in vaginal bleeding during your period or between your periods -chest pain -coughing up blood -dizziness or fainting spells -headaches or migraines -leg, arm or groin pain -severe or sudden headaches -stomach  pain (severe) -sudden shortness of breath -sudden loss of coordination, especially on one side of the body -speech problems -symptoms of vaginal infection like itching, irritation or unusual discharge -tenderness in the upper abdomen -vomiting -weakness or numbness in the arms or legs, especially on one side of the body -yellowing of the eyes or skin Side effects that usually do not require medical attention (report to your doctor or health care professional if they continue or are bothersome): -breakthrough bleeding and spotting that continues beyond the 3 initial cycles of pills -breast tenderness -mood changes, anxiety, depression, frustration, anger, or emotional outbursts -increased sensitivity to sun or ultraviolet light -nausea -skin rash, acne, or brown spots on the skin -weight gain (slight) This list may not describe all possible side effects. Call your doctor for medical advice about side effects. You may report side effects to FDA at 1-800-FDA-1088. Where should I keep my medicine? Keep out of the reach of children. Store at room temperature between 15 and 30 degrees C (59 and 86 degrees F) for up to 4 months. The product will expire after 4 months. Protect from light. Throw away any unused medicine after the expiration date. NOTE: This sheet is a summary. It may not cover all possible information. If you have questions about this medicine, talk to your doctor, pharmacist, or health care provider.  2018 Elsevier/Gold Standard (2016-07-31 17:00:31) Ethinyl Estradiol; Etonogestrel vaginal ring What is this medicine? ETHINYL ESTRADIOL; ETONOGESTREL (ETH in il es tra DYE ole; et oh noe JES trel) vaginal ring is a flexible, vaginal ring used as a contraceptive (birth control method). This medicine combines two types of female hormones, an estrogen and a progestin. This ring is used to prevent ovulation and pregnancy. Each ring is effective for one month. This medicine may be used  for other purposes; ask your health care provider or pharmacist if you have questions. COMMON BRAND NAME(S): NuvaRing What should I tell my health care provider before I take this medicine? They need to know if you have or ever had any of these conditions: -abnormal vaginal bleeding -blood vessel disease or blood clots -breast, cervical, endometrial, ovarian, liver, or uterine cancer -diabetes -gallbladder disease -heart disease or recent heart attack -high blood pressure -high cholesterol -kidney disease -liver disease -migraine headaches -stroke -systemic lupus erythematosus (SLE) -tobacco smoker -an unusual or allergic reaction to estrogens, progestins, other medicines, foods, dyes, or preservatives -pregnant or trying to get pregnant -breast-feeding How should I use this medicine? Insert the ring into your vagina as directed. Follow the directions on the prescription label. The ring will remain place for 3 weeks and is then removed for a 1-week break. A new ring is inserted 1 week after the last ring was removed, on the same day of the week. Check often to make sure the ring is still in place, especially before and after sexual intercourse. If the ring was out of the vagina for an unknown amount of time, you may not be protected from pregnancy. Perform a pregnancy test and call your doctor. Do not use more often than directed. A patient package insert for the product will be given with each prescription and refill. Read this sheet carefully each time. The sheet may change frequently. Contact your pediatrician regarding  the use of this medicine in children. Special care may be needed. This medicine has been used in female children who have started having menstrual periods. Overdosage: If you think you have taken too much of this medicine contact a poison control center or emergency room at once. NOTE: This medicine is only for you. Do not share this medicine with others. What if I miss  a dose? You will need to replace your vaginal ring once a month as directed. If the ring should slip out, or if you leave it in longer or shorter than you should, contact your health care professional for advice. What may interact with this medicine? Do not take this medicine with the following medication: -dasabuvir; ombitasvir; paritaprevir; ritonavir -ombitasvir; paritaprevir; ritonavir This medicine may also interact with the following medications: -acetaminophen -antibiotics or medicines for infections, especially rifampin, rifabutin, rifapentine, and griseofulvin, and possibly penicillins or tetracyclines -aprepitant -ascorbic acid (vitamin C) -atorvastatin -barbiturate medicines, such as phenobarbital -bosentan -carbamazepine -caffeine -clofibrate -cyclosporine -dantrolene -doxercalciferol -felbamate -grapefruit juice -hydrocortisone -medicines for anxiety or sleeping problems, such as diazepam or temazepam -medicines for diabetes, including pioglitazone -modafinil -mycophenolate -nefazodone -oxcarbazepine -phenytoin -prednisolone -ritonavir or other medicines for HIV infection or AIDS -rosuvastatin -selegiline -soy isoflavones supplements -St. John's wort -tamoxifen or raloxifene -theophylline -thyroid hormones -topiramate -warfarin This list may not describe all possible interactions. Give your health care provider a list of all the medicines, herbs, non-prescription drugs, or dietary supplements you use. Also tell them if you smoke, drink alcohol, or use illegal drugs. Some items may interact with your medicine. What should I watch for while using this medicine? Visit your doctor or health care professional for regular checks on your progress. You will need a regular breast and pelvic exam and Pap smear while on this medicine. Use an additional method of contraception during the first cycle that you use this ring. Do not use a diaphragm or female condom, as the  ring can interfere with these birth control methods and their proper placement. If you have any reason to think you are pregnant, stop using this medicine right away and contact your doctor or health care professional. If you are using this medicine for hormone related problems, it may take several cycles of use to see improvement in your condition. Smoking increases the risk of getting a blood clot or having a stroke while you are using hormonal birth control, especially if you are more than 23 years old. You are strongly advised not to smoke. This medicine can make your body retain fluid, making your fingers, hands, or ankles swell. Your blood pressure can go up. Contact your doctor or health care professional if you feel you are retaining fluid. This medicine can make you more sensitive to the sun. Keep out of the sun. If you cannot avoid being in the sun, wear protective clothing and use sunscreen. Do not use sun lamps or tanning beds/booths. If you wear contact lenses and notice visual changes, or if the lenses begin to feel uncomfortable, consult your eye care specialist. In some women, tenderness, swelling, or minor bleeding of the gums may occur. Notify your dentist if this happens. Brushing and flossing your teeth regularly may help limit this. See your dentist regularly and inform your dentist of the medicines you are taking. If you are going to have elective surgery, you may need to stop using this medicine before the surgery. Consult your health care professional for advice. This medicine does not protect you against HIV  infection (AIDS) or any other sexually transmitted diseases. What side effects may I notice from receiving this medicine? Side effects that you should report to your doctor or health care professional as soon as possible: -breast tissue changes or discharge -changes in vaginal bleeding during your period or between your periods -chest pain -coughing up blood -dizziness or  fainting spells -headaches or migraines -leg, arm or groin pain -severe or sudden headaches -stomach pain (severe) -sudden shortness of breath -sudden loss of coordination, especially on one side of the body -speech problems -symptoms of vaginal infection like itching, irritation or unusual discharge -tenderness in the upper abdomen -vomiting -weakness or numbness in the arms or legs, especially on one side of the body -yellowing of the eyes or skin Side effects that usually do not require medical attention (report to your doctor or health care professional if they continue or are bothersome): -breakthrough bleeding and spotting that continues beyond the 3 initial cycles of pills -breast tenderness -mood changes, anxiety, depression, frustration, anger, or emotional outbursts -increased sensitivity to sun or ultraviolet light -nausea -skin rash, acne, or brown spots on the skin -weight gain (slight) This list may not describe all possible side effects. Call your doctor for medical advice about side effects. You may report side effects to FDA at 1-800-FDA-1088. Where should I keep my medicine? Keep out of the reach of children. Store at room temperature between 15 and 30 degrees C (59 and 86 degrees F) for up to 4 months. The product will expire after 4 months. Protect from light. Throw away any unused medicine after the expiration date. NOTE: This sheet is a summary. It may not cover all possible information. If you have questions about this medicine, talk to your doctor, pharmacist, or health care provider.  2018 Elsevier/Gold Standard (2016-07-31 17:00:31) Etonogestrel implant What is this medicine? ETONOGESTREL (et oh noe JES trel) is a contraceptive (birth control) device. It is used to prevent pregnancy. It can be used for up to 3 years. This medicine may be used for other purposes; ask your health care provider or pharmacist if you have questions. COMMON BRAND NAME(S): Implanon,  Nexplanon What should I tell my health care provider before I take this medicine? They need to know if you have any of these conditions: -abnormal vaginal bleeding -blood vessel disease or blood clots -cancer of the breast, cervix, or liver -depression -diabetes -gallbladder disease -headaches -heart disease or recent heart attack -high blood pressure -high cholesterol -kidney disease -liver disease -renal disease -seizures -tobacco smoker -an unusual or allergic reaction to etonogestrel, other hormones, anesthetics or antiseptics, medicines, foods, dyes, or preservatives -pregnant or trying to get pregnant -breast-feeding How should I use this medicine? This device is inserted just under the skin on the inner side of your upper arm by a health care professional. Talk to your pediatrician regarding the use of this medicine in children. Special care may be needed. Overdosage: If you think you have taken too much of this medicine contact a poison control center or emergency room at once. NOTE: This medicine is only for you. Do not share this medicine with others. What if I miss a dose? This does not apply. What may interact with this medicine? Do not take this medicine with any of the following medications: -amprenavir -bosentan -fosamprenavir This medicine may also interact with the following medications: -barbiturate medicines for inducing sleep or treating seizures -certain medicines for fungal infections like ketoconazole and itraconazole -grapefruit juice -griseofulvin -medicines to treat  seizures like carbamazepine, felbamate, oxcarbazepine, phenytoin, topiramate -modafinil -phenylbutazone -rifampin -rufinamide -some medicines to treat HIV infection like atazanavir, indinavir, lopinavir, nelfinavir, tipranavir, ritonavir -St. John's wort This list may not describe all possible interactions. Give your health care provider a list of all the medicines, herbs,  non-prescription drugs, or dietary supplements you use. Also tell them if you smoke, drink alcohol, or use illegal drugs. Some items may interact with your medicine. What should I watch for while using this medicine? This product does not protect you against HIV infection (AIDS) or other sexually transmitted diseases. You should be able to feel the implant by pressing your fingertips over the skin where it was inserted. Contact your doctor if you cannot feel the implant, and use a non-hormonal birth control method (such as condoms) until your doctor confirms that the implant is in place. If you feel that the implant may have broken or become bent while in your arm, contact your healthcare provider. What side effects may I notice from receiving this medicine? Side effects that you should report to your doctor or health care professional as soon as possible: -allergic reactions like skin rash, itching or hives, swelling of the face, lips, or tongue -breast lumps -changes in emotions or moods -depressed mood -heavy or prolonged menstrual bleeding -pain, irritation, swelling, or bruising at the insertion site -scar at site of insertion -signs of infection at the insertion site such as fever, and skin redness, pain or discharge -signs of pregnancy -signs and symptoms of a blood clot such as breathing problems; changes in vision; chest pain; severe, sudden headache; pain, swelling, warmth in the leg; trouble speaking; sudden numbness or weakness of the face, arm or leg -signs and symptoms of liver injury like dark yellow or brown urine; general ill feeling or flu-like symptoms; light-colored stools; loss of appetite; nausea; right upper belly pain; unusually weak or tired; yellowing of the eyes or skin -unusual vaginal bleeding, discharge -signs and symptoms of a stroke like changes in vision; confusion; trouble speaking or understanding; severe headaches; sudden numbness or weakness of the face, arm or  leg; trouble walking; dizziness; loss of balance or coordination Side effects that usually do not require medical attention (report to your doctor or health care professional if they continue or are bothersome): -acne -back pain -breast pain -changes in weight -dizziness -general ill feeling or flu-like symptoms -headache -irregular menstrual bleeding -nausea -sore throat -vaginal irritation or inflammation This list may not describe all possible side effects. Call your doctor for medical advice about side effects. You may report side effects to FDA at 1-800-FDA-1088. Where should I keep my medicine? This drug is given in a hospital or clinic and will not be stored at home. NOTE: This sheet is a summary. It may not cover all possible information. If you have questions about this medicine, talk to your doctor, pharmacist, or health care provider.  2018 Elsevier/Gold Standard (2016-06-11 11:19:22)

## 2018-08-10 NOTE — Progress Notes (Signed)
BP 138/85   Pulse (!) 105   Temp (!) 97.5 F (36.4 C) (Oral)   Ht 5\' 5"  (1.651 m)   Wt 161 lb 8 oz (73.3 kg)   SpO2 99%   BMI 26.88 kg/m    Subjective:    Patient ID: Doris Thomas, female    DOB: [birth year], 22 y.o.   MRN: [id number]  HPI: Doris Thomas is a 22 y.o. female  Chief Complaint  Patient presents with  . Fatigue    x a week  . Nausea  . Emesis    since this morning/pt has taken peptobismol   Here today with 1 day hx of nausea and vomiting. Taking Pepto bismol with good relief. Able to keep water down, has not tried eating. Niece also sick with same sxs. Denies hematemesis, abdominal pain, diarrhea, recent travel, rashes.   Fatigue worsening x 1 week. Does not sleep very well. Not taking anything OTC for sleep currently. Hx of vit D deficiency as well, no longer on supplementation.   Birth control - interested in nexplanon, depo injection, or nuva ring. Not sure which she wants to move forward with.   Past Medical History:  Diagnosis Date  . Vitamin D deficiency    Social History   Socioeconomic History  . Marital status: Single    Spouse name: Not on file  . Number of children: Not on file  . Years of education: Not on file  . Highest education level: Not on file  Occupational History  . Not on file  Social Needs  . Financial resource strain: Not on file  . Food insecurity:    Worry: Not on file    Inability: Not on file  . Transportation needs:    Medical: Not on file    Non-medical: Not on file  Tobacco Use  . Smoking status: Never Smoker  . Smokeless tobacco: Never Used  Substance and Sexual Activity  . Alcohol use: No    Frequency: Never  . Drug use: No  . Sexual activity: Yes    Birth control/protection: Pill  Lifestyle  . Physical activity:    Days per week: Not on file    Minutes per session: Not on file  . Stress: Not on file  Relationships  . Social connections:    Talks on phone: Not on file    Gets together: Not on file    Attends religious service: Not on file    Active member of club or organization: Not on file    Attends meetings of clubs or organizations: Not on file    Relationship status: Not on file  . Intimate partner violence:    Fear of current or ex partner: Not on file    Emotionally abused: Not on file    Physically abused: Not on file    Forced sexual activity: Not on file  Other Topics Concern  . Not on file  Social History Narrative  . Not on file    Relevant past medical, surgical, family and social history reviewed and updated as indicated. Interim medical history since our last visit reviewed. Allergies and medications reviewed and updated.  Review of Systems  Per HPI unless specifically indicated above     Objective:    BP 138/85   Pulse (!) 105   Temp (!) 97.5 F (36.4 C) (Oral)   Ht 5\' 5"  (1.651 m)   Wt 161 lb 8 oz (73.3 kg)   SpO2 99%  BMI 26.88 kg/m   Wt Readings from Last 3 Encounters:  08/10/18 161 lb 8 oz (73.3 kg)  06/15/18 170 lb 11.2 oz (77.4 kg)  05/13/18 167 lb 9.6 oz (76 kg)    Physical Exam  Constitutional: She is oriented to person, place, and time. She appears well-developed and well-nourished. No distress.  HENT:  Head: Atraumatic.  Eyes: Conjunctivae and EOM are normal.  Neck: Normal range of motion. Neck supple.  Cardiovascular: Regular rhythm.  Mildly tachycardic rate  Pulmonary/Chest: Effort normal and breath sounds normal.  Abdominal: Soft. Bowel sounds are normal. She exhibits no distension. There is no tenderness. There is no guarding.  Musculoskeletal: Normal range of motion.  Neurological: She is alert and oriented to person, place, and time.  Skin: Skin is warm and dry.  Psychiatric: She has a normal mood and affect. Her behavior is normal.  Nursing note and vitals reviewed.   Results for orders placed or performed in visit on 05/13/18  Rapid Strep Screen ([facility] ONLY)  Result Value Ref Range   Strep Gp A Ag, IA W/Reflex  Negative Negative  Culture, Group A Strep  Result Value Ref Range   Strep A Culture Negative   Culture, Group A Strep  Result Value Ref Range   Strep A Culture CANCELED       Assessment & Plan:   Problem List Items Addressed This Visit      Other   Encounter for contraceptive management    Discussed with patient that we can manage any contraception except nexplanon and IUDs, which she could get with her GYN. Materials given for her to look over. She will let us know what she decides to move forward with.        Other Visit Diagnoses    Nausea and vomiting, intractability of vomiting not specified, unspecified vomiting type    -  Primary   Declines nausea medication. Suspect viral illness. Push fluids, BRAT diet, stay out of work the next day or two. F/u if worsening or not improving   Fatigue, unspecified type       Sleep hygiene and OTC remedies reviewed. Will get labs at upcoming CPE.        Follow up plan: Return for CPE.

## 2018-08-15 NOTE — Assessment & Plan Note (Signed)
Discussed with patient that we can manage any contraception except nexplanon and IUDs, which she could get with her GYN. Materials given for her to look over. She will let us know what she decides to move forward with.

## 2018-10-10 ENCOUNTER — Encounter: Payer: Managed Care, Other (non HMO) | Admitting: Family Medicine

## 2018-10-11 ENCOUNTER — Encounter: Payer: Self-pay | Admitting: Family Medicine

## 2018-11-07 ENCOUNTER — Encounter: Payer: Self-pay | Admitting: Surgical

## 2018-11-08 ENCOUNTER — Encounter: Payer: Managed Care, Other (non HMO) | Admitting: Obstetrics and Gynecology

## 2019-01-16 ENCOUNTER — Encounter: Payer: Self-pay | Admitting: Family Medicine

## 2019-01-16 ENCOUNTER — Ambulatory Visit (INDEPENDENT_AMBULATORY_CARE_PROVIDER_SITE_OTHER): Payer: Managed Care, Other (non HMO) | Admitting: Family Medicine

## 2019-01-16 VITALS — BP 124/73 | HR 89 | Temp 98.5°F | Ht 65.0 in | Wt 180.1 lb

## 2019-01-16 DIAGNOSIS — J069 Acute upper respiratory infection, unspecified: Secondary | ICD-10-CM

## 2019-01-16 LAB — VERITOR FLU A/B WAIVED
INFLUENZA B: NEGATIVE
Influenza A: NEGATIVE

## 2019-01-16 MED ORDER — FLUTICASONE PROPIONATE 50 MCG/ACT NA SUSP: 1.0000 | Freq: Two times a day (BID) | NASAL | 6 refills | Status: DC

## 2019-01-16 NOTE — Progress Notes (Signed)
BP 124/73 (BP Location: Left Arm, Patient Position: Sitting, Cuff Size: Normal)   Pulse 89   Temp 98.5 F (36.9 C) (Oral)   Ht 5\' 5"  (1.651 m)   Wt 180 lb 1.6 oz (81.7 kg)   SpO2 100%   BMI 29.97 kg/m    Subjective:    Patient ID: Doris Thomas, female    DOB: Apr 09, 1996, 23 y.o.   MRN: [id number]  HPI: Doris Thomas is a 23 y.o. female  Chief Complaint  Patient presents with  . Nasal Congestion    Ongoing 3 days.  . Headache  . Fever   3-4 days of fever, headache, nausea, nasal congestion, sinus pain and pressure. Denies body aches, chills, sweats, Cp, SOB. Several sick contacts. Not taking anything other than tylenol for sxs.   Relevant past medical, surgical, family and social history reviewed and updated as indicated. Interim medical history since our last visit reviewed. Allergies and medications reviewed and updated.  Review of Systems  Per HPI unless specifically indicated above     Objective:    BP 124/73 (BP Location: Left Arm, Patient Position: Sitting, Cuff Size: Normal)   Pulse 89   Temp 98.5 F (36.9 C) (Oral)   Ht 5\' 5"  (1.651 m)   Wt 180 lb 1.6 oz (81.7 kg)   SpO2 100%   BMI 29.97 kg/m   Wt Readings from Last 3 Encounters:  01/16/19 180 lb 1.6 oz (81.7 kg)  08/10/18 161 lb 8 oz (73.3 kg)  06/15/18 170 lb 11.2 oz (77.4 kg)    Physical Exam Vitals signs and nursing note reviewed.  Constitutional:      Appearance: Normal appearance.  HENT:     Head: Atraumatic.     Right Ear: Tympanic membrane and external ear normal.     Left Ear: Tympanic membrane and external ear normal.     Nose: Rhinorrhea present.     Mouth/Throat:     Mouth: Mucous membranes are moist.     Pharynx: Posterior oropharyngeal erythema present.  Eyes:     Extraocular Movements: Extraocular movements intact.     Conjunctiva/sclera: Conjunctivae normal.  Neck:     Musculoskeletal: Normal range of motion and neck supple.  Cardiovascular:     Rate and Rhythm: Normal rate  and regular rhythm.     Heart sounds: Normal heart sounds.  Pulmonary:     Effort: Pulmonary effort is normal.     Breath sounds: Normal breath sounds. No wheezing.  Abdominal:     General: Bowel sounds are normal.     Palpations: Abdomen is soft.     Tenderness: There is no abdominal tenderness.  Musculoskeletal: Normal range of motion.  Lymphadenopathy:     Cervical: No cervical adenopathy.  Skin:    General: Skin is warm and dry.  Neurological:     Mental Status: She is alert and oriented to person, place, and time.  Psychiatric:        Mood and Affect: Mood normal.        Thought Content: Thought content normal.     Results for orders placed or performed in visit on 01/16/19  Veritor Flu A/B Waived  Result Value Ref Range   Influenza A Negative Negative   Influenza B Negative Negative      Assessment & Plan:   Problem List Items Addressed This Visit    None    Visit Diagnoses    Viral URI    -  Primary   Rapid flu neg, tx with symptomatic care with dayquil, flonase and supportive care. Strict return precautions given.    Relevant Orders   Veritor Flu A/B Waived (Completed)       Follow up plan: Return if symptoms worsen or fail to improve.

## 2019-01-20 ENCOUNTER — Other Ambulatory Visit: Payer: Self-pay | Admitting: Family Medicine

## 2019-01-20 ENCOUNTER — Encounter: Payer: Self-pay | Admitting: Family Medicine

## 2019-02-08 ENCOUNTER — Encounter: Payer: Self-pay | Admitting: Family Medicine

## 2019-02-08 ENCOUNTER — Other Ambulatory Visit: Payer: Self-pay | Admitting: Family Medicine

## 2019-02-08 ENCOUNTER — Ambulatory Visit (INDEPENDENT_AMBULATORY_CARE_PROVIDER_SITE_OTHER): Payer: Managed Care, Other (non HMO) | Admitting: Family Medicine

## 2019-02-08 VITALS — BP 142/97 | HR 99 | Temp 99.4°F | Wt 185.0 lb

## 2019-02-08 DIAGNOSIS — J111 Influenza due to unidentified influenza virus with other respiratory manifestations: Secondary | ICD-10-CM | POA: Diagnosis not present

## 2019-02-08 LAB — VERITOR FLU A/B WAIVED
Influenza A: NEGATIVE
Influenza B: NEGATIVE

## 2019-02-08 MED ORDER — PROMETHAZINE-DM 6.25-15 MG/5ML PO SYRP: 2.5000 mL | ORAL_SOLUTION | Freq: Four times a day (QID) | ORAL | 0 refills | Status: DC | PRN

## 2019-02-08 MED ORDER — BENZONATATE 200 MG PO CAPS: 200.0000 mg | ORAL_CAPSULE | Freq: Three times a day (TID) | ORAL | 0 refills | Status: DC | PRN

## 2019-02-08 MED ORDER — BALOXAVIR MARBOXIL(80 MG DOSE) 2 X 40 MG PO TBPK: 80.0000 mg | ORAL_TABLET | Freq: Once | ORAL | 0 refills | Status: AC

## 2019-02-08 NOTE — Progress Notes (Signed)
BP (!) 142/97   Pulse 99   Temp 99.4 F (37.4 C) (Oral)   Wt 185 lb (83.9 kg)   SpO2 99%   BMI 30.79 kg/m    Subjective:    Patient ID: Doris Thomas, female    DOB: 10-05-1996, 23 y.o.   MRN: [id number]  HPI: Doris Thomas is a 23 y.o. female  Chief Complaint  Patient presents with  . Nasal Congestion    Started Sunday night  . Headache  . Generalized Body Aches   Fever, chills, generalized body aches, congestion, productive cough x 5 days. Taking OTC cold and cough medication with mild temporary relief. No CP, SOB, wheezing, N/V/D. Several sick contacts. No known hx of allergies or pulmonary dz.   Relevant past medical, surgical, family and social history reviewed and updated as indicated. Interim medical history since our last visit reviewed. Allergies and medications reviewed and updated.  Review of Systems  Per HPI unless specifically indicated above     Objective:    BP (!) 142/97   Pulse 99   Temp 99.4 F (37.4 C) (Oral)   Wt 185 lb (83.9 kg)   SpO2 99%   BMI 30.79 kg/m   Wt Readings from Last 3 Encounters:  02/08/19 185 lb (83.9 kg)  01/16/19 180 lb 1.6 oz (81.7 kg)  08/10/18 161 lb 8 oz (73.3 kg)    Physical Exam Vitals signs and nursing note reviewed.  Constitutional:      Appearance: Normal appearance. She is ill-appearing.  HENT:     Head: Atraumatic.     Right Ear: Tympanic membrane and external ear normal.     Left Ear: Tympanic membrane and external ear normal.     Nose: Congestion present.     Mouth/Throat:     Mouth: Mucous membranes are moist.     Pharynx: Posterior oropharyngeal erythema present.  Eyes:     Extraocular Movements: Extraocular movements intact.     Conjunctiva/sclera: Conjunctivae normal.  Neck:     Musculoskeletal: Normal range of motion and neck supple.  Cardiovascular:     Rate and Rhythm: Normal rate and regular rhythm.     Heart sounds: Normal heart sounds.  Pulmonary:     Effort: Pulmonary effort is normal.      Breath sounds: Normal breath sounds. No wheezing or rales.  Musculoskeletal: Normal range of motion.  Skin:    General: Skin is warm and dry.  Neurological:     Mental Status: She is alert and oriented to person, place, and time.  Psychiatric:        Mood and Affect: Mood normal.        Thought Content: Thought content normal.     Results for orders placed or performed in visit on 01/16/19  Veritor Flu A/B Waived  Result Value Ref Range   Influenza A Negative Negative   Influenza B Negative Negative      Assessment & Plan:   Problem List Items Addressed This Visit    None    Visit Diagnoses    Influenza    -  Primary   Rapid flu neg, but sxs consistent. Tx with xofluza, tessalon, phenergan DM. Supportive care and return precautions reviewed   Relevant Medications   Baloxavir Marboxil,80 MG Dose, (XOFLUZA) 2 x 40 MG TBPK   Other Relevant Orders   Veritor Flu A/B Waived       Follow up plan: Return if symptoms worsen or fail to  improve.

## 2019-02-08 NOTE — Telephone Encounter (Signed)
Product not available per pharmacy. Is there something else to prescribe.

## 2019-02-09 ENCOUNTER — Other Ambulatory Visit: Payer: Self-pay | Admitting: Family Medicine

## 2019-02-09 MED ORDER — OSELTAMIVIR PHOSPHATE 75 MG PO CAPS: 75.0000 mg | ORAL_CAPSULE | Freq: Two times a day (BID) | ORAL | 0 refills | Status: DC

## 2019-02-09 NOTE — Telephone Encounter (Signed)
Pt was seen yesterday and this prescription was sent I'm confused as to whether or not she needs to try another pharmacy being that it's on back order or did you refuse it because she doesn't need it? Please advise.

## 2019-02-09 NOTE — Telephone Encounter (Signed)
Message relayed to patient.

## 2019-02-09 NOTE — Telephone Encounter (Signed)
Sent in tamiflu instead

## 2019-02-09 NOTE — Telephone Encounter (Signed)
Can take nyquil or delsym since they don't have the cough syrup in stock

## 2019-02-09 NOTE — Telephone Encounter (Signed)
Pharmacy notification- Rx is not available

## 2019-02-09 NOTE — Telephone Encounter (Signed)
Patient states that the flu medication is 80.00 with the coupon, is there something else that she can have or what should she do?

## 2019-02-09 NOTE — Telephone Encounter (Signed)
Left message on machine for pt to return call to the office.  

## 2019-02-10 NOTE — Telephone Encounter (Signed)
Had already received  A request to switch prior to this message and had to clinical team call to let her know to just take delsym or nyquil at bedtime - not sure why they keep sending this request

## 2019-02-10 NOTE — Telephone Encounter (Signed)
LVM for pt.

## 2019-02-13 ENCOUNTER — Encounter: Payer: Self-pay | Admitting: Family Medicine

## 2019-02-13 ENCOUNTER — Other Ambulatory Visit: Payer: Self-pay | Admitting: Family Medicine

## 2019-02-13 MED ORDER — AZITHROMYCIN 250 MG PO TABS: ORAL_TABLET | ORAL | 0 refills | Status: DC

## 2019-02-14 NOTE — Telephone Encounter (Signed)
LVM again for pt to call back if she needed to be seen again.

## 2019-02-17 NOTE — Telephone Encounter (Signed)
Letter generated

## 2019-02-17 NOTE — Telephone Encounter (Signed)
Please generate work excuse for her for Monday and Tuesday. Thanks

## 2019-03-13 ENCOUNTER — Ambulatory Visit (INDEPENDENT_AMBULATORY_CARE_PROVIDER_SITE_OTHER): Payer: Managed Care, Other (non HMO) | Admitting: Family Medicine

## 2019-03-13 ENCOUNTER — Other Ambulatory Visit: Payer: Self-pay

## 2019-03-13 ENCOUNTER — Encounter: Payer: Self-pay | Admitting: Family Medicine

## 2019-03-13 VITALS — Temp 99.8°F | Ht 65.0 in | Wt 178.0 lb

## 2019-03-13 DIAGNOSIS — R05 Cough: Secondary | ICD-10-CM | POA: Diagnosis not present

## 2019-03-13 DIAGNOSIS — R509 Fever, unspecified: Secondary | ICD-10-CM

## 2019-03-13 DIAGNOSIS — Z20828 Contact with and (suspected) exposure to other viral communicable diseases: Secondary | ICD-10-CM | POA: Diagnosis not present

## 2019-03-13 DIAGNOSIS — Z20822 Contact with and (suspected) exposure to covid-19: Secondary | ICD-10-CM

## 2019-03-13 DIAGNOSIS — R059 Cough, unspecified: Secondary | ICD-10-CM

## 2019-03-13 NOTE — Progress Notes (Signed)
Temp 99.8 F (37.7 C) (Oral)   Ht 5\' 5"  (1.651 m)   Wt 178 lb (80.7 kg)   BMI 29.62 kg/m    Subjective:    Patient ID: Doris Thomas, female    DOB: October 10, 1996, 23 y.o.   MRN: [id number]  HPI: Doris Thomas is a 23 y.o. female  Chief Complaint  Patient presents with  . COVID19 Exposure    Had dinner with a family a member 4 days ago. Family member tested positive for COVID19.   . Fever    Temperature was 100.3 this morning  . Cough  . Generalized Body Aches  . Severe Headache  . Sneezing    . This visit was completed via WebEx due to the restrictions of the COVID-19 pandemic. All issues as above were discussed and addressed. Physical exam was done as above through visual confirmation on WebEx. If it was felt that the patient should be evaluated in the office, they were directed there. The patient verbally consented to this visit. . Location of the patient: home . Location of the provider: home . Those involved with this call:  . Provider: Roosvelt Maser, PA-C . CMA: Myrtha Mantis, CMA . Front Desk/Registration: Harriet Pho  . Time spent on call: 25 minutes with patient face to face via video conference. More than 50% of this time was spent in counseling and coordination of care. 5 minutes total spent in review of patient's record and preparation of their chart.  Patient c/o fever, chills, body aches, cough, sneezing, and chest tightness that started last night. Denies CP, SOB, N/V/D. Has been resting and taking ibuprofen and tylenol with mild relief. Concerned as she just found out a family member she was with on Friday tested positive for COVID-19.   Relevant past medical, surgical, family and social history reviewed and updated as indicated. Interim medical history since our last visit reviewed. Allergies and medications reviewed and updated.  Review of Systems  Per HPI unless specifically indicated above     Objective:    Temp 99.8 F (37.7 C) (Oral)   Ht 5\' 5"   (1.651 m)   Wt 178 lb (80.7 kg)   BMI 29.62 kg/m   Wt Readings from Last 3 Encounters:  03/13/19 178 lb (80.7 kg)  02/08/19 185 lb (83.9 kg)  01/16/19 180 lb 1.6 oz (81.7 kg)    Physical Exam Vitals signs and nursing note reviewed.  Constitutional:      General: She is not in acute distress.    Appearance: Normal appearance.  HENT:     Head: Atraumatic.     Right Ear: External ear normal.     Left Ear: External ear normal.     Nose: Congestion present.     Mouth/Throat:     Mouth: Mucous membranes are moist.     Pharynx: Oropharynx is clear. Posterior oropharyngeal erythema present.  Eyes:     Extraocular Movements: Extraocular movements intact.     Conjunctiva/sclera: Conjunctivae normal.  Neck:     Musculoskeletal: Normal range of motion.  Cardiovascular:     Comments: Unable to assess via virtual visit Pulmonary:     Effort: Pulmonary effort is normal. No respiratory distress.  Musculoskeletal: Normal range of motion.  Skin:    General: Skin is dry.     Findings: No erythema.  Neurological:     Mental Status: She is alert and oriented to person, place, and time.  Psychiatric:  Mood and Affect: Mood normal.        Thought Content: Thought content normal.        Judgment: Judgment normal.     Results for orders placed or performed in visit on 02/08/19  Veritor Flu A/B Waived  Result Value Ref Range   Influenza A Negative Negative   Influenza B Negative Negative      Assessment & Plan:   Problem List Items Addressed This Visit    None    Visit Diagnoses    Cough    -  Primary   Exposure to Covid-19 Virus       Fever, unspecified fever cause         Given recent known exposure and consistent sxs, high suspicion for infection from COVID-19. Per organization and CDC guidelines will refrain from testing and move forward with a 2 week quarantine. Reviewed what this entails, supportive care, return precautions. Work note given to cover the two weeks, will  check in at least once prior to return to work. Pt agreeable to plan.   Follow up plan: Return in about 2 weeks (around 03/27/2019) for URI f/u.

## 2019-03-14 ENCOUNTER — Encounter: Payer: Self-pay | Admitting: Family Medicine

## 2019-03-14 ENCOUNTER — Ambulatory Visit: Payer: Self-pay

## 2019-03-14 NOTE — Telephone Encounter (Signed)
Pt called and requested a work note that states that she is under quarantine until April 20 th. Pt stated that her employer is requesting that it says that she is under quarantine.  Email verified.

## 2019-03-15 ENCOUNTER — Other Ambulatory Visit: Payer: Self-pay | Admitting: Obstetrics and Gynecology

## 2019-03-15 ENCOUNTER — Other Ambulatory Visit: Payer: Self-pay | Admitting: Family Medicine

## 2019-03-15 NOTE — Telephone Encounter (Signed)
Letter printed to mail. °

## 2019-03-15 NOTE — Telephone Encounter (Signed)
As discussed during the visit, I have released a work note to her mychart account. If she needs it printed we can mail it to her house

## 2019-03-16 ENCOUNTER — Other Ambulatory Visit: Payer: Self-pay | Admitting: Obstetrics and Gynecology

## 2019-03-17 ENCOUNTER — Other Ambulatory Visit: Payer: Self-pay | Admitting: Obstetrics and Gynecology

## 2019-03-24 ENCOUNTER — Telehealth: Payer: Self-pay | Admitting: Family Medicine

## 2019-03-24 NOTE — Telephone Encounter (Signed)
Called pt to check in on how she's doing as she's on 2 week quarantine for possible COVID 19. VM was full, could not leave message. Please try her later and check in. Does not look like she's got a f/u to return to work scheduled yet either so if possible let's get that scheduled

## 2019-03-24 NOTE — Telephone Encounter (Signed)
Called and spoke with patient, she is feeling a lot better. Virtual visit set up for 4/20 @ 1:00pm.

## 2019-03-27 ENCOUNTER — Other Ambulatory Visit: Payer: Self-pay

## 2019-03-27 ENCOUNTER — Encounter: Payer: Self-pay | Admitting: Family Medicine

## 2019-03-27 ENCOUNTER — Ambulatory Visit (INDEPENDENT_AMBULATORY_CARE_PROVIDER_SITE_OTHER): Payer: Managed Care, Other (non HMO) | Admitting: Family Medicine

## 2019-03-27 VITALS — Ht 65.0 in | Wt 175.0 lb

## 2019-03-27 DIAGNOSIS — Z20822 Contact with and (suspected) exposure to covid-19: Secondary | ICD-10-CM

## 2019-03-27 DIAGNOSIS — R6889 Other general symptoms and signs: Secondary | ICD-10-CM

## 2019-03-27 DIAGNOSIS — Z20828 Contact with and (suspected) exposure to other viral communicable diseases: Secondary | ICD-10-CM | POA: Diagnosis not present

## 2019-03-27 NOTE — Progress Notes (Signed)
Ht 5\' 5"  (1.651 m)   Wt 175 lb (79.4 kg)   BMI 29.12 kg/m    Subjective:    Patient ID: Doris SkeansKaila A Guarisco, female    DOB: 1996/03/13, 23 y.o.   MRN: [id number]  HPI: Doris Thomas is a 23 y.o. female  Chief Complaint  Patient presents with  . Work Release    From [employer] of COVID[employer]. Patient states she feels a lot better, still has some fatigue but that's all symptoms patients provided. Denies any fever/cough.    . This visit was completed via WebEx due to the restrictions of the COVID-19 pandemic. All issues as above were discussed and addressed. Physical exam was done as above through visual confirmation on WebEx. If it was felt that the patient should be evaluated in the office, they were directed there. The patient verbally consented to this visit. . Location of the patient: home . Location of the provider: home . Those involved with this call:  . Provider: Roosvelt Maserachel Larri Brewton, PA-C . CMA: Myrtha MantisKeri Bullock, CMA . Front Desk/Registration: Harriet PhoJoliza Johnson  . Time spent on call: 15 minutes with patient face to face via video conference. More than 50% of this time was spent in counseling and coordination of care. 5 minutes total spent in review of patient's record and preparation of their chart.  I verified patient identity using two factors (patient name and date of birth). Patient consents verbally to being seen via telemedicine visit today.   Patient here for clearance to return to work after 2 week quarantine for suspected Covid 19 infection due to consistent sxs and known positive sick contact. States her fever lasted nearly a week but has been gone now without medication assistance for about a week. Still very tired, but no CP, SOB, cough, wheezing, sore throat, congestion lingering. Not currently taking anything OTC just resting often. No new concerns. Feels ready to return to work.   Relevant past medical, surgical, family and social history reviewed and updated as indicated. Interim medical  history since our last visit reviewed. Allergies and medications reviewed and updated.  Review of Systems  Per HPI unless specifically indicated above     Objective:    Ht 5\' 5"  (1.651 m)   Wt 175 lb (79.4 kg)   BMI 29.12 kg/m   Wt Readings from Last 3 Encounters:  03/27/19 175 lb (79.4 kg)  03/13/19 178 lb (80.7 kg)  02/08/19 185 lb (83.9 kg)    Physical Exam Vitals signs and nursing note reviewed.  Constitutional:      General: She is not in acute distress.    Appearance: Normal appearance.  HENT:     Head: Atraumatic.     Right Ear: External ear normal.     Left Ear: External ear normal.     Nose: Nose normal. No congestion.     Mouth/Throat:     Mouth: Mucous membranes are moist.     Pharynx: Oropharynx is clear. No posterior oropharyngeal erythema.  Eyes:     Extraocular Movements: Extraocular movements intact.     Conjunctiva/sclera: Conjunctivae normal.  Neck:     Musculoskeletal: Normal range of motion.  Cardiovascular:     Comments: Unable to assess via virtual visit Pulmonary:     Effort: Pulmonary effort is normal. No respiratory distress.  Musculoskeletal: Normal range of motion.  Skin:    General: Skin is dry.     Findings: No erythema.  Neurological:     Mental Status: She  is alert and oriented to person, place, and time.  Psychiatric:        Mood and Affect: Mood normal.        Thought Content: Thought content normal.        Judgment: Judgment normal.     Results for orders placed or performed in visit on 02/08/19  Veritor Flu A/B Waived  Result Value Ref Range   Influenza A Negative Negative   Influenza B Negative Negative      Assessment & Plan:   Problem List Items Addressed This Visit    None    Visit Diagnoses    Suspected Covid-19 Virus Infection    -  Primary   Post-two week home quarantine, afebrile x 1 week with only some lingering fatigue. Return to work note sent, return precautions reviewed   Close Exposure to Covid-19  Virus           Follow up plan: Return if symptoms worsen or fail to improve.

## 2019-04-03 ENCOUNTER — Telehealth: Payer: Self-pay | Admitting: Family Medicine

## 2019-04-03 NOTE — Telephone Encounter (Signed)
I unfortunately do not write this medication

## 2019-04-03 NOTE — Telephone Encounter (Signed)
Patient needs virtual appt to get her phenpermine 37.5mg  tab normally her OB-GYN provider normally fills the script but he is not seeing patients. Please advise  Thank you

## 2019-04-03 NOTE — Telephone Encounter (Signed)
Patient notified

## 2019-04-27 ENCOUNTER — Encounter: Payer: Self-pay | Admitting: *Deleted

## 2019-04-28 ENCOUNTER — Ambulatory Visit: Payer: Managed Care, Other (non HMO) | Admitting: Obstetrics and Gynecology

## 2019-04-28 ENCOUNTER — Other Ambulatory Visit: Payer: Self-pay

## 2019-04-28 ENCOUNTER — Encounter: Payer: Self-pay | Admitting: Obstetrics and Gynecology

## 2019-04-28 VITALS — BP 127/84 | HR 88 | Ht 65.0 in | Wt 197.5 lb

## 2019-04-28 DIAGNOSIS — Z7689 Persons encountering health services in other specified circumstances: Secondary | ICD-10-CM | POA: Diagnosis not present

## 2019-04-28 DIAGNOSIS — Z6832 Body mass index (BMI) 32.0-32.9, adult: Secondary | ICD-10-CM | POA: Diagnosis not present

## 2019-04-28 MED ORDER — CYANOCOBALAMIN 1000 MCG/ML IJ SOLN: 1000.0000 ug | INTRAMUSCULAR | 1 refills | Status: DC

## 2019-04-28 MED ORDER — PHENTERMINE HCL 37.5 MG PO TABS: 37.5000 mg | ORAL_TABLET | Freq: Every day | ORAL | 2 refills | Status: DC

## 2019-04-28 NOTE — Progress Notes (Signed)
Subjective:  Doris Thomas is a 23 y.o. G0P0000 at Unknown being seen today for weight loss management- initial visit.  Patient reports General ROS: negative and reports previous weight loss attempts:were successful with adipex in the past. Management changes made at the last visit include: Has not been able to go to the gym due to covid pandemic, but is starting to walk daily until the gyms reopen.  Desires to restart meds. The following portions of the patient's history were reviewed and updated as appropriate: allergies, current medications, past family history, past medical history, past social history, past surgical history and problem list.   Objective:   Vitals:   04/28/19 1429  BP: 127/84  Pulse: 88  Weight: 197 lb 8 oz (89.6 kg)  Height: 5\' 5"  (1.651 m)    General:  Alert, oriented and cooperative. Patient is in no acute distress.  :   :   :   :   :   :   PE: Well groomed female in no current distress,   Mental Status: Normal mood and affect. Normal behavior. Normal judgment and thought content.   Current BMI: Body mass index is 32.87 kg/m.   Assessment and Plan:  Obesity  There are no diagnoses linked to this encounter.  Plan: low carb, High protein diet RX for adipex 37.5 mg daily and B12 .ml monthly, to start now with first injection given at today's visit. Reviewed side-effects common to both medications and expected outcomes. Increase daily water intake to at least 8 bottle a day, every day.  Goal is to reduse weight by 10% by end of three months, and will re-evaluate then.  RTC in 4 weeks for Nurse visit to check weight & BP, and get next B12 injections.    Please refer to After Visit Summary for other counseling recommendations.    [place], Melody N, CNM   [name], CNM      Consider the Low Glycemic Index Diet and 6 smaller meals daily .  This boosts your metabolism and regulates your sugars:   Use the protein bar by [employer]  because they have lots of fiber in them  Find the low carb flatbreads, tortillas and pita breads for sandwiches:  Joseph's makes a pita bread and a flat bread , available at [facility] and [employer]; Toufayah makes a low carb flatbread available at [employer] and HT that is 9 net carbs and 100 cal [place] makes a low carb whole wheat tortilla available at [employer] most grocery stores with 6 net carbs and 210 cal  [place] yogurt can still have a lot of carbs .  Dannon Light N fit has 80 cal and 8 carbs

## 2019-04-28 NOTE — Progress Notes (Signed)
Waist 36.5in

## 2019-05-10 ENCOUNTER — Encounter: Payer: Managed Care, Other (non HMO) | Admitting: Obstetrics and Gynecology

## 2019-06-01 ENCOUNTER — Encounter: Payer: Self-pay | Admitting: Family Medicine

## 2019-06-01 NOTE — Telephone Encounter (Signed)
Needs appt

## 2019-06-05 ENCOUNTER — Ambulatory Visit (INDEPENDENT_AMBULATORY_CARE_PROVIDER_SITE_OTHER)
Admission: RE | Admit: 2019-06-05 | Discharge: 2019-06-05 | Disposition: A | Discharge status: Home / Self Care | Payer: Self-pay | Source: Ambulatory Visit

## 2019-06-05 ENCOUNTER — Telehealth: Payer: Self-pay | Admitting: *Deleted

## 2019-06-05 ENCOUNTER — Other Ambulatory Visit: Payer: Self-pay | Admitting: Family Medicine

## 2019-06-05 ENCOUNTER — Other Ambulatory Visit: Payer: Self-pay

## 2019-06-05 ENCOUNTER — Encounter: Payer: Self-pay | Admitting: Family Medicine

## 2019-06-05 DIAGNOSIS — Z20822 Contact with and (suspected) exposure to covid-19: Secondary | ICD-10-CM

## 2019-06-05 DIAGNOSIS — J069 Acute upper respiratory infection, unspecified: Secondary | ICD-10-CM

## 2019-06-05 MED ORDER — IBUPROFEN 800 MG PO TABS: 800.0000 mg | ORAL_TABLET | Freq: Three times a day (TID) | ORAL | 0 refills | Status: DC

## 2019-06-05 MED ORDER — FLUTICASONE PROPIONATE 50 MCG/ACT NA SUSP: 1.0000 | Freq: Every day | NASAL | 0 refills | Status: DC

## 2019-06-05 MED ORDER — CETIRIZINE HCL 10 MG PO CAPS: 10.0000 mg | ORAL_CAPSULE | Freq: Every day | ORAL | 0 refills | Status: DC

## 2019-06-05 NOTE — Discharge Instructions (Signed)
Some will be in contact with you to set up COVID testing on [place]  Throat irritation most likely from drainage, begin daily cetirizine/Zyrtec, please take consistently, these medicines work better when you are taking consistently for 3 to 4 days  Please use Flonase nasal spray 1 to 2 sprays in each nostril daily to help open up sinuses and allow ears to drain  If you are developing persistent or worsening pain in the ears or developing fevers please follow-up in person to be evaluated for ear infection and need for antibiotics

## 2019-06-05 NOTE — Telephone Encounter (Signed)
-----   Message from Janith Lima, PA-C sent at 06/05/2019  9:34 AM EDT ----- Regarding: Needs COVId testing Known exposure, URI symptoms x 3 days; [place] location

## 2019-06-05 NOTE — ED Provider Notes (Signed)
Virtual Visit via Video Note:  Doris Thomas  initiated request for Telemedicine visit with [facility] team. I connected with Doris Thomas  on 06/05/2019 at 9:28 AM  for a synchronized telemedicine visit using a video enabled HIPPA compliant telemedicine application. I verified that I am speaking with Doris Thomas  using two identifiers. Jaylie Neaves C Paxton Kanaan, PA-C  was physically located in a [facility] site and Doris Thomas was located at a different location.   The limitations of evaluation and management by telemedicine as well as the availability of in-person appointments were discussed. Patient was informed that she  may incur a bill ( including co-pay) for this virtual visit encounter. Doris Thomas  expressed understanding and gave verbal consent to proceed with virtual visit.     History of Present Illness:Doris Thomas  is a 23 y.o. female presents with concern for URI symptoms and possible COVID.  Patient states that at work she had exposure to someone who has tested positive last week.  Over the past 3 days she has developed left ear pain, scratchy throat and congestion.  She denies any cough.  Denies chest pain or shortness of breath.  She has been taking aspirin without relief.  Her main complaint is her ear discomfort.  Denies any drainage.  Denies any fevers chills or body aches.  Past Medical History:  Diagnosis Date  . Vitamin D deficiency     No Known Allergies      Observations/Objective: Physical Exam  Constitutional: She is oriented to person, place, and time. No distress.  HENT:  Head: Normocephalic and atraumatic.  Eyes: Conjunctivae are normal.  Neck: Normal range of motion. Neck supple.  Pulmonary/Chest: Effort normal. No respiratory distress.  Speaking in full sentences, no coughing during exam  Musculoskeletal:     Comments: Moving extremities appropriately  Neurological: She is alert and oriented to person, place, and time.  Face  symmetric, speech clear     Assessment and Plan: URI symptoms x5 days, known exposure to COVID.  Will set up testing, information sent to [facility].  Throat irritation most likely drainage, recommend daily cetirizine.  Discussed with patient unable to diagnose ear infection over video and based off symptoms unclear if related to fluid secondary to congestion versus throat discomfort radiating to ears.  Will initiate on Flonase to help with any eustachian tube dysfunction contributing to symptoms.  Advised to follow-up in person for direct visualization of ears if pain persisting or worsening or developing fevers.Discussed strict return precautions. Patient verbalized understanding and is agreeable with plan.     ICD-10-CM   1. Viral URI  J06.9      Follow Up Instructions:    I discussed the assessment and treatment plan with the patient. The patient was provided an opportunity to ask questions and all were answered. The patient agreed with the plan and demonstrated an understanding of the instructions.   The patient was advised to call back or seek an in-person evaluation if the symptoms worsen or if the condition fails to improve as anticipated.     Janith Lima, PA-C  06/05/2019 9:28 AM         Janith Lima, PA-C 06/05/19 1609

## 2019-06-05 NOTE — Telephone Encounter (Signed)
Pt scheduled for covid testing today @ 10:45 @ [facility]. Instructions given and order placed.

## 2019-06-08 LAB — NOVEL CORONAVIRUS, NAA: SARS-CoV-2, NAA: NOT DETECTED

## 2019-06-12 ENCOUNTER — Other Ambulatory Visit: Payer: Self-pay

## 2019-06-12 ENCOUNTER — Ambulatory Visit (INDEPENDENT_AMBULATORY_CARE_PROVIDER_SITE_OTHER): Payer: Managed Care, Other (non HMO) | Admitting: Family Medicine

## 2019-06-12 ENCOUNTER — Encounter: Payer: Self-pay | Admitting: Family Medicine

## 2019-06-12 VITALS — Temp 98.6°F | Ht 65.0 in | Wt 170.0 lb

## 2019-06-12 DIAGNOSIS — J069 Acute upper respiratory infection, unspecified: Secondary | ICD-10-CM | POA: Diagnosis not present

## 2019-06-12 NOTE — Progress Notes (Signed)
Temp 98.6 F (37 C) (Oral)   Ht 5\' 5"  (1.651 m)   Wt 170 lb (77.1 kg) Comment: Patient reported  BMI 28.29 kg/m    Subjective:    Patient ID: Doris Thomas, female    DOB: 11/05/1996, 23 y.o.   MRN: [id number]  HPI: Doris SkeansKaila A Pine is a 23 y.o. female  Chief Complaint  Patient presents with  . Headache    COVID19 testing came back negative. Patient still having some symptoms. Headache is the biggest complaint. Supposed to go back to work Monday, but needs to be cleared by employer.  . Ear Pain  . Nasal Congestion    . This visit was completed via WebEx due to the restrictions of the COVID-19 pandemic. All issues as above were discussed and addressed. Physical exam was done as above through visual confirmation on WebEx. If it was felt that the patient should be evaluated in the office, they were directed there. The patient verbally consented to this visit. . Location of the patient: home . Location of the provider: home . Those involved with this call:  . Provider: Roosvelt Maserachel Lane, PA-C . CMA: Myrtha MantisKeri Bullock, CMA . Front Desk/Registration: Harriet PhoJoliza Johnson  . Time spent on call: 15 minutes with patient face to face via video conference. More than 50% of this time was spent in counseling and coordination of care. 5 minutes total spent in review of patient's record and preparation of their chart. I verified patient identity using two factors (patient name and date of birth). Patient consents verbally to being seen via telemedicine visit today.   Patient here following up on previous sick symptoms of congestion, headache sore throat, ear pain. COVID 19 testing came back negative and patient states over the weekend her final sxs eased up. Still having some mild ear pressure but otherwise feels well. Not taking anything currently OTC for sxs. No fevers, chills, body aches, cough, CP, SOB. Needs note to return to work.   Relevant past medical, surgical, family and social history reviewed and  updated as indicated. Interim medical history since our last visit reviewed. Allergies and medications reviewed and updated.  Review of Systems  Per HPI unless specifically indicated above     Objective:    Temp 98.6 F (37 C) (Oral)   Ht 5\' 5"  (1.651 m)   Wt 170 lb (77.1 kg) Comment: Patient reported  BMI 28.29 kg/m   Wt Readings from Last 3 Encounters:  06/12/19 170 lb (77.1 kg)  04/28/19 197 lb 8 oz (89.6 kg)  03/27/19 175 lb (79.4 kg)    Physical Exam Vitals signs and nursing note reviewed.  Constitutional:      General: She is not in acute distress.    Appearance: Normal appearance.  HENT:     Head: Atraumatic.     Right Ear: External ear normal.     Left Ear: External ear normal.     Nose: Nose normal. No congestion.     Mouth/Throat:     Mouth: Mucous membranes are moist.     Pharynx: Oropharynx is clear. No posterior oropharyngeal erythema.  Eyes:     Extraocular Movements: Extraocular movements intact.     Conjunctiva/sclera: Conjunctivae normal.  Neck:     Musculoskeletal: Normal range of motion.  Cardiovascular:     Comments: Unable to assess via virtual visit Pulmonary:     Effort: Pulmonary effort is normal. No respiratory distress.  Musculoskeletal: Normal range of motion.  Skin:  General: Skin is dry.     Findings: No erythema.  Neurological:     Mental Status: She is alert and oriented to person, place, and time.  Psychiatric:        Mood and Affect: Mood normal.        Thought Content: Thought content normal.        Judgment: Judgment normal.     Results for orders placed or performed in visit on 06/05/19  Novel Coronavirus, NAA ([facility])  Result Value Ref Range   SARS-CoV-2, NAA Not Detected Not Detected      Assessment & Plan:   Problem List Items Addressed This Visit    None    Visit Diagnoses    Upper respiratory tract infection, unspecified type    -  Primary   COVID 19 neg, sxs resolved. Will clear back to work. Return if  sxs return/worsen. Supportive care reviewed       Follow up plan: Return if symptoms worsen or fail to improve.

## 2019-07-19 ENCOUNTER — Ambulatory Visit: Payer: Managed Care, Other (non HMO) | Admitting: Family Medicine

## 2019-08-16 LAB — FETAL NONSTRESS TEST

## 2019-08-17 ENCOUNTER — Other Ambulatory Visit: Payer: Self-pay

## 2019-08-17 ENCOUNTER — Ambulatory Visit (INDEPENDENT_AMBULATORY_CARE_PROVIDER_SITE_OTHER): Payer: Managed Care, Other (non HMO) | Admitting: Family Medicine

## 2019-08-17 ENCOUNTER — Encounter: Payer: Self-pay | Admitting: Family Medicine

## 2019-08-17 VITALS — BP 136/87 | HR 89 | Temp 98.8°F | Ht 65.0 in | Wt 196.0 lb

## 2019-08-17 DIAGNOSIS — Z20822 Contact with and (suspected) exposure to covid-19: Secondary | ICD-10-CM

## 2019-08-17 DIAGNOSIS — G44209 Tension-type headache, unspecified, not intractable: Secondary | ICD-10-CM

## 2019-08-17 MED ORDER — CYCLOBENZAPRINE HCL 10 MG PO TABS: 10.0000 mg | ORAL_TABLET | Freq: Every evening | ORAL | 0 refills | Status: DC | PRN

## 2019-08-17 NOTE — Progress Notes (Signed)
BP 136/87   Pulse 89   Temp 98.8 F (37.1 C) (Oral)   Ht 5\' 5"  (1.651 m)   Wt 196 lb (88.9 kg)   SpO2 99%   BMI 32.62 kg/m    Subjective:    Patient ID: Doris Thomas, female    DOB: [birth year], 23 y.o.   MRN: [id number]  HPI: Doris Thomas is a 23 y.o. female  Chief Complaint  Patient presents with  . Headache    x over a week. tried OTC tylenol. not always helping   Patient here today concerned about some daily frontal headaches she's been having for about a week now. States it feels like a squeezing sensation across forehead from one side to the other. No associated visual changes, photophobia, phonophobia, N/V, dizziness, confusion. Recently started back at the gym but otherwise there have been no changes to her routine, diet, or medications. No hx of migraines. Taking aleve and tylenol with minimal lasting relief.   Relevant past medical, surgical, family and social history reviewed and updated as indicated. Interim medical history since our last visit reviewed. Allergies and medications reviewed and updated.  Review of Systems  Per HPI unless specifically indicated above     Objective:    BP 136/87   Pulse 89   Temp 98.8 F (37.1 C) (Oral)   Ht 5\' 5"  (1.651 m)   Wt 196 lb (88.9 kg)   SpO2 99%   BMI 32.62 kg/m   Wt Readings from Last 3 Encounters:  08/17/19 196 lb (88.9 kg)  06/12/19 170 lb (77.1 kg)  04/28/19 197 lb 8 oz (89.6 kg)    Physical Exam Vitals signs and nursing note reviewed.  Constitutional:      Appearance: Normal appearance. She is not ill-appearing.  HENT:     Head: Atraumatic.  Eyes:     Extraocular Movements: Extraocular movements intact.     Conjunctiva/sclera: Conjunctivae normal.  Neck:     Musculoskeletal: Normal range of motion and neck supple.  Cardiovascular:     Rate and Rhythm: Normal rate and regular rhythm.     Heart sounds: Normal heart sounds.  Pulmonary:     Effort: Pulmonary effort is normal.     Breath sounds:  Normal breath sounds.  Musculoskeletal: Normal range of motion.  Skin:    General: Skin is warm and dry.  Neurological:     General: No focal deficit present.     Mental Status: She is alert and oriented to person, place, and time.     Sensory: No sensory deficit.     Motor: No weakness.     Coordination: Coordination normal.     Gait: Gait normal.  Psychiatric:        Mood and Affect: Mood normal.        Thought Content: Thought content normal.        Judgment: Judgment normal.     Results for orders placed or performed in visit on 06/05/19  Novel Coronavirus, NAA ([facility])  Result Value Ref Range   SARS-CoV-2, NAA Not Detected Not Detected      Assessment & Plan:   Problem List Items Addressed This Visit    None    Visit Diagnoses    Tension headache    -  Primary   Suspect tension headache from restarting workouts. Stretching, massage, epsom salt soaks. Flexeril given for prn use. OTC pain relievers prn   Relevant Medications   cyclobenzaprine (FLEXERIL) 10 MG  tablet       Follow up plan: Return if symptoms worsen or fail to improve.

## 2019-08-18 LAB — NOVEL CORONAVIRUS, NAA: SARS-CoV-2, NAA: NOT DETECTED

## 2019-08-23 ENCOUNTER — Telehealth: Payer: Self-pay

## 2019-11-06 ENCOUNTER — Other Ambulatory Visit: Payer: Self-pay

## 2019-11-06 ENCOUNTER — Emergency Department: Payer: Self-pay

## 2019-11-06 ENCOUNTER — Emergency Department
Admission: EM | Admit: 2019-11-06 | Discharge: 2019-11-06 | Disposition: A | Discharge status: Home / Self Care | Payer: Self-pay | Attending: Emergency Medicine | Admitting: Emergency Medicine

## 2019-11-06 ENCOUNTER — Encounter: Payer: Self-pay | Admitting: *Deleted

## 2019-11-06 DIAGNOSIS — Y93I9 Activity, other involving external motion: Secondary | ICD-10-CM | POA: Diagnosis not present

## 2019-11-06 DIAGNOSIS — Y999 Unspecified external cause status: Secondary | ICD-10-CM | POA: Diagnosis not present

## 2019-11-06 DIAGNOSIS — S161XXA Strain of muscle, fascia and tendon at neck level, initial encounter: Secondary | ICD-10-CM | POA: Diagnosis not present

## 2019-11-06 DIAGNOSIS — Y92411 Interstate highway as the place of occurrence of the external cause: Secondary | ICD-10-CM | POA: Insufficient documentation

## 2019-11-06 DIAGNOSIS — S199XXA Unspecified injury of neck, initial encounter: Secondary | ICD-10-CM | POA: Diagnosis present

## 2019-11-06 MED ORDER — METHOCARBAMOL 500 MG PO TABS: 500.0000 mg | ORAL_TABLET | Freq: Four times a day (QID) | ORAL | 0 refills | Status: DC

## 2019-11-06 MED ORDER — MELOXICAM 15 MG PO TABS: 15.0000 mg | ORAL_TABLET | Freq: Every day | ORAL | 2 refills | Status: DC
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
## 2019-11-06 NOTE — Discharge Instructions (Addendum)
Follow-up with your regular doctor if not better in 5 to 7 days.  Or you may follow-up with orthopedics.  Please call for appointment.  Apply ice every area that hurts.  Take medications as prescribed.
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
## 2019-11-10 ENCOUNTER — Encounter: Payer: Self-pay | Admitting: Family Medicine

## 2019-11-14 ENCOUNTER — Encounter: Payer: Self-pay | Admitting: Family Medicine

## 2019-11-14 ENCOUNTER — Telehealth: Payer: Self-pay | Admitting: Family Medicine

## 2019-11-14 ENCOUNTER — Other Ambulatory Visit: Payer: Self-pay

## 2019-11-14 ENCOUNTER — Ambulatory Visit (INDEPENDENT_AMBULATORY_CARE_PROVIDER_SITE_OTHER): Payer: Self-pay | Admitting: Family Medicine

## 2019-11-14 VITALS — Temp 98.9°F

## 2019-11-14 DIAGNOSIS — S161XXD Strain of muscle, fascia and tendon at neck level, subsequent encounter: Secondary | ICD-10-CM

## 2019-11-14 NOTE — Telephone Encounter (Signed)
New note sent  Copied from [place] [id number]. Topic: General - Other >> Nov 14, 2019  9:44 AM Erick Blinks wrote: Reason for CRM: Pt called back to request a new doctor's note. Must include the return date of 11/20/2019, please advise  Best contact: [phone]
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
## 2019-12-08 NOTE — L&D Delivery Note (Addendum)
Date of delivery: 08/16/2020 Estimated Date of Delivery: 08/24/20 Patient's last menstrual period was 11/18/2019 (exact date). EGA: [redacted]w[redacted]d  Delivery Note At 6:37 PM a viable female was delivered via Vaginal, Spontaneous (Presentation: Left Occiput Anterior).  APGAR: 8, 9; weight: 6 pounds 1 ounce, 2760 grams  Placenta status: Spontaneous, Intact, Circumvallate.  Cord: 3 vessels with the following complications: None, central insertion.  Cord pH: NA Placenta to pathology due to IUGR, absent right kidney  Went to see patient after laboring down period.  Mom pushed with only 2 contractions to deliver a viable female infant.  The head followed by shoulders, which delivered without difficulty, and the rest of the body.  No nuchal cord noted.  Baby to mom's chest.  Cord clamped and cut after 3 min delay.  No cord blood obtained.  Placenta delivered spontaneously, intact, with a 3-vessel cord.  No cervical, vaginal, perineal or labial lacerations.  All counts correct.  Hemostasis obtained with IV pitocin and fundal massage.   Anesthesia: Epidural Episiotomy: None Lacerations:   Suture Repair: NA Est. Blood Loss (mL):  500  Mom to postpartum.  Baby to Couplet care / Skin to Skin.  Tresea Mall, CNM 08/16/2020, 7:36 PM

## 2019-12-12 ENCOUNTER — Other Ambulatory Visit: Payer: Self-pay

## 2019-12-12 ENCOUNTER — Encounter: Payer: Self-pay | Admitting: Emergency Medicine

## 2019-12-12 ENCOUNTER — Emergency Department
Admission: EM | Admit: 2019-12-12 | Discharge: 2019-12-12 | Disposition: A | Discharge status: Home / Self Care | Payer: 59 | Attending: Student | Admitting: Student

## 2019-12-12 DIAGNOSIS — B349 Viral infection, unspecified: Secondary | ICD-10-CM

## 2019-12-12 DIAGNOSIS — U071 COVID-19: Secondary | ICD-10-CM | POA: Diagnosis not present

## 2019-12-12 DIAGNOSIS — R519 Headache, unspecified: Secondary | ICD-10-CM | POA: Insufficient documentation

## 2019-12-12 DIAGNOSIS — Z20822 Contact with and (suspected) exposure to covid-19: Secondary | ICD-10-CM

## 2019-12-12 DIAGNOSIS — R0981 Nasal congestion: Secondary | ICD-10-CM | POA: Diagnosis present

## 2019-12-12 NOTE — ED Provider Notes (Signed)
[facility] Emergency Department Provider Note  ____________________________________________  Time seen: Approximately 3:30 PM  I have reviewed the triage vital signs and the nursing notes.   HISTORY  Chief Complaint Nasal Congestion and Headache   HPI Doris Thomas is a 24 y.o. female presents to the emergency department for treatment and evaluation of COVID-19 symptoms after exposure. She has had loss of taste and smell for the past 2-3 days. No fever, shortness of breath, cough, nausea, vomiting, or diarrhea.     Past Medical History:  Diagnosis Date  . Vitamin D deficiency     Patient Active Problem List   Diagnosis Date Noted  . Vitamin D deficiency 10/18/2017  . Overweight (BMI 25.0-29.9) 10/18/2017  . Encounter for contraceptive management 10/18/2017    History reviewed. No pertinent surgical history.  Prior to Admission medications   Not on File    Allergies Patient has no known allergies.  Family History  Problem Relation Age of Onset  . Cancer Maternal Grandmother   . Diabetes Paternal Grandmother     Social History Social History   Tobacco Use  . Smoking status: Never Smoker  . Smokeless tobacco: Never Used  Substance Use Topics  . Alcohol use: No  . Drug use: No    Review of Systems Constitutional: Negative for fever/chills. normal appetite. ENT: No sore throat. Cardiovascular: Denies chest pain. Respiratory: No shortness of breath. Negative for cough. no wheezing.  Gastrointestinal: No nausea,  no vomiting.  no diarrhea.  Musculoskeletal: Negative for body aches Skin: Negative for rash. Neurological: Negataive for headaches ____________________________________________   PHYSICAL EXAM:  VITAL SIGNS: ED Triage Vitals  Enc Vitals Group     BP 12/12/19 1448 (!) 147/74     Pulse Rate 12/12/19 1448 (!) 102     Resp 12/12/19 1448 14     Temp 12/12/19 1448 98.6 F (37 C)     Temp Source 12/12/19 1448 Oral   SpO2 12/12/19 1448 100 %     Weight 12/12/19 1449 175 lb (79.4 kg)     Height 12/12/19 1449 5\' 5"  (1.651 m)     Head Circumference --      Peak Flow --      Pain Score 12/12/19 1448 3     Pain Loc --      Pain Edu? --      Excl. in GC? --     Constitutional: Alert and oriented. Well appearing and in no acute distress. Eyes: Conjunctivae are normal. Ears: TM normal Nose: No sinus congestion noted; no rhinnorhea. Mouth/Throat: Mucous membranes are moist.  Oropharynx clear. Tonsils normal. Uvula midline. Neck: No stridor.  Lymphatic: No cervical lymphadenopathy. Cardiovascular: Normal rate, regular rhythm. Good peripheral circulation. Respiratory: Respirations are even and unlabored.  No retractions. Breath sounds clear. Gastrointestinal: Soft and nontender.  Musculoskeletal: FROM x 4 extremities.  Neurologic:  Normal speech and language. Skin:  Skin is warm, dry and intact. No rash noted. Psychiatric: Mood and affect are normal. Speech and behavior are normal.  ____________________________________________   LABS (all labs ordered are listed, but only abnormal results are displayed)  Labs Reviewed  SARS CORONAVIRUS 2 (TAT 6-24 HRS)   ____________________________________________  EKG  Not indicated. ____________________________________________  RADIOLOGY  Not indicated. ____________________________________________   PROCEDURES  Procedure(s) performed: None  Critical Care performed: No ____________________________________________   INITIAL IMPRESSION / ASSESSMENT AND PLAN / ED COURSE  24 y.o. female presents to the ER after COVID exposure and subsequent loss  of taste and smell. No other symptoms. She will be tested and has been given instruction on quarantine. She is to follow up with primary care or return to the ER for symptoms of concern.  Doris Thomas was evaluated in Emergency Department on 12/12/2019 for the symptoms described in the history of present  illness. She was evaluated in the context of the global COVID-19 pandemic, which necessitated consideration that the patient might be at risk for infection with the SARS-CoV-2 virus that causes COVID-19. Institutional protocols and algorithms that pertain to the evaluation of patients at risk for COVID-19 are in a state of rapid change based on information released by regulatory bodies including the CDC and federal and state organizations. These policies and algorithms were followed during the patient's care in the ED.    Medications - No data to display  ED Discharge Orders    None       Pertinent labs & imaging results that were available during my care of the patient were reviewed by me and considered in my medical decision making (see chart for details).    If controlled substance prescribed during this visit, 12 month history viewed on the [facility] prior to issuing an initial prescription for Schedule II or III opiod. ____________________________________________   FINAL CLINICAL IMPRESSION(S) / ED DIAGNOSES  Final diagnoses:  Viral illness  Close exposure to COVID-19 virus    Note:  This document was prepared using Dragon voice recognition software and may include unintentional dictation errors.    Victorino Dike, FNP 12/12/19 1535    Lilia Pro., MD 12/13/19 [phone]

## 2019-12-12 NOTE — ED Triage Notes (Signed)
expoesd to covid dec 26th.  2-3 days ago started with loss of taste, nasal congestion and headache.

## 2019-12-12 NOTE — ED Notes (Signed)
See triage note  States she developed lost of taste and some nasal congestion 2 days ago  States she was exposed to COVID at Christmas  Denies any fever

## 2019-12-13 ENCOUNTER — Telehealth: Payer: Self-pay | Admitting: Emergency Medicine

## 2019-12-13 LAB — SARS CORONAVIRUS 2 (TAT 6-24 HRS): SARS Coronavirus 2: POSITIVE — AB

## 2019-12-13 NOTE — Telephone Encounter (Signed)
Called patient to assure she is aware of covid result.  Left message.

## 2020-01-02 ENCOUNTER — Ambulatory Visit: Payer: Self-pay

## 2020-01-02 NOTE — Telephone Encounter (Signed)
Pt. Reports she was diagnosed with COVID 19 12/12/19. Has some shortness of breath with walking long distances and walking up one flight of stairs, "which is not normal for me. I feel better, but still have this lingering." Some times has wheezing while in bed at night. No fever. Occasional cough. Overall feels better. Has an appointment for 01/09/20 (virtual). Pt. Asking if she could be seen sooner. Please advise.  Answer Assessment - Initial Assessment Questions 1. COVID-19 DIAGNOSIS: "Who made your Coronavirus (COVID-19) diagnosis?" "Was it confirmed by a positive lab test?" If not diagnosed by a HCP, ask "Are there lots of cases (community spread) where you live?" (See public health department website, if unsure)     Yes - 12/12/19 2. COVID-19 EXPOSURE: "Was there any known exposure to COVID before the symptoms began?" CDC Definition of close contact: within 6 feet (2 meters) for a total of 15 minutes or more over a 24-hour period.      Yes 3. ONSET: "When did the COVID-19 symptoms start?"      12/12/19 4. WORST SYMPTOM: "What is your worst symptom?" (e.g., cough, fever, shortness of breath, muscle aches)     Shortness of breath exertion - walking up 1 flight of stairs 5. COUGH: "Do you have a cough?" If so, ask: "How bad is the cough?"       Occasional  6. FEVER: "Do you have a fever?" If so, ask: "What is your temperature, how was it measured, and when did it start?"     No 7. RESPIRATORY STATUS: "Describe your breathing?" (e.g., shortness of breath, wheezing, unable to speak)      With exertion, wheezing at night at times 8. BETTER-SAME-WORSE: "Are you getting better, staying the same or getting worse compared to yesterday?"  If getting worse, ask, "In what way?"     Better 9. HIGH RISK DISEASE: "Do you have any chronic medical problems?" (e.g., asthma, heart or lung disease, weak immune system, obesity, etc.)     No 10. PREGNANCY: "Is there any chance you are pregnant?" "When was your last  menstrual period?"       Yes - not confirmed 11. OTHER SYMPTOMS: "Do you have any other symptoms?"  (e.g., chills, fatigue, headache, loss of smell or taste, muscle pain, sore throat; new loss of smell or taste especially support the diagnosis of COVID-19)       Still no sense of smell  Protocols used: CORONAVIRUS (COVID-19) DIAGNOSED OR SUSPECTED-A-AH

## 2020-01-03 ENCOUNTER — Encounter: Payer: Self-pay | Admitting: Family Medicine

## 2020-01-03 ENCOUNTER — Ambulatory Visit (INDEPENDENT_AMBULATORY_CARE_PROVIDER_SITE_OTHER): Payer: 59 | Admitting: Family Medicine

## 2020-01-03 VITALS — Temp 98.6°F | Wt 170.0 lb

## 2020-01-03 DIAGNOSIS — U071 COVID-19: Secondary | ICD-10-CM

## 2020-01-03 NOTE — Progress Notes (Signed)
Temp 98.6 F (37 C)   Wt 170 lb (77.1 kg)   BMI 28.29 kg/m    Subjective:    Patient ID: Doris Thomas, female    DOB: 07-22-1996, 24 y.o.   MRN: [id number]  HPI: Doris Thomas is a 24 y.o. female  Chief Complaint  Patient presents with  . COVID Positive    Tested positive on December 12, 2019, needs a note to return to work    UPPER RESPIRATORY TRACT INFECTION Duration: 3 weeks ago Worst symptom: SOB with exertion, loss of taste or smell Fever: no Cough: yes- dry, rarely Shortness of breath: yes Wheezing: no Chest pain: no Chest tightness: no Chest congestion: no Nasal congestion: no Runny nose: no Post nasal drip: no Sneezing: no Sore throat: no Swollen glands: no Sinus pressure: no Headache: no Face pain: no Toothache: no Ear pain: no  Ear pressure: no  Eyes red/itching:no Eye drainage/crusting: no  Vomiting: no Rash: no Fatigue: yes Sick contacts: no Strep contacts: no  Context: better Recurrent sinusitis: no Relief with OTC cold/cough medications: no  Treatments attempted: none   Relevant past medical, surgical, family and social history reviewed and updated as indicated. Interim medical history since our last visit reviewed. Allergies and medications reviewed and updated.  Review of Systems  Constitutional: Negative.   HENT: Negative.   Respiratory: Positive for cough and shortness of breath. Negative for apnea, choking, chest tightness, wheezing and stridor.   Cardiovascular: Negative.   Gastrointestinal: Negative.   Neurological: Negative.   Psychiatric/Behavioral: Negative.     Per HPI unless specifically indicated above     Objective:    Temp 98.6 F (37 C)   Wt 170 lb (77.1 kg)   BMI 28.29 kg/m   Wt Readings from Last 3 Encounters:  01/03/20 170 lb (77.1 kg)  12/12/19 175 lb (79.4 kg)  11/06/19 185 lb (83.9 kg)    Physical Exam Vitals and nursing note reviewed.  Constitutional:      General: She is not in acute distress.   Appearance: Normal appearance. She is not ill-appearing, toxic-appearing or diaphoretic.  HENT:     Head: Normocephalic and atraumatic.     Right Ear: External ear normal.     Left Ear: External ear normal.     Nose: Nose normal.     Mouth/Throat:     Mouth: Mucous membranes are moist.     Pharynx: Oropharynx is clear.  Eyes:     General: No scleral icterus.       Right eye: No discharge.        Left eye: No discharge.     Conjunctiva/sclera: Conjunctivae normal.     Pupils: Pupils are equal, round, and reactive to light.  Pulmonary:     Effort: Pulmonary effort is normal. No respiratory distress.     Comments: Speaking in full sentences Musculoskeletal:        General: Normal range of motion.     Cervical back: Normal range of motion.  Skin:    Coloration: Skin is not jaundiced or pale.     Findings: No bruising, erythema, lesion or rash.  Neurological:     Mental Status: She is alert and oriented to person, place, and time. Mental status is at baseline.  Psychiatric:        Mood and Affect: Mood normal.        Behavior: Behavior normal.        Thought Content: Thought content normal.  Judgment: Judgment normal.     Results for orders placed or performed during the hospital encounter of 12/12/19  SARS CORONAVIRUS 2 (TAT 6-24 HRS) Nasopharyngeal Nasopharyngeal Swab   Specimen: Nasopharyngeal Swab  Result Value Ref Range   SARS Coronavirus 2 POSITIVE (A) NEGATIVE      Assessment & Plan:   Problem List Items Addressed This Visit    None    Visit Diagnoses    COVID-19    -  Primary   Resolved. Good to return to work on Monday. Call with any concerns.       Follow up plan: Return if symptoms worsen or fail to improve.   . This visit was completed via [employer] due to the restrictions of the COVID-19 pandemic. All issues as above were discussed and addressed. Physical exam was done as above through visual confirmation on [employer]. If it was felt that the  patient should be evaluated in the office, they were directed there. The patient verbally consented to this visit. . Location of the patient: home . Location of the provider: home . Those involved with this call:  . Provider: Olevia Perches, DO . CMA: Tiffany Reel, CMA . Front Desk/Registration: Adela Ports  . Time spent on call: 15 minutes with patient face to face via video conference. More than 50% of this time was spent in counseling and coordination of care. 23 minutes total spent in review of patient's record and preparation of their chart.

## 2020-01-04 ENCOUNTER — Ambulatory Visit (INDEPENDENT_AMBULATORY_CARE_PROVIDER_SITE_OTHER): Payer: 59 | Admitting: Advanced Practice Midwife

## 2020-01-04 ENCOUNTER — Encounter: Payer: Self-pay | Admitting: Advanced Practice Midwife

## 2020-01-04 ENCOUNTER — Other Ambulatory Visit (HOSPITAL_COMMUNITY)
Admission: RE | Admit: 2020-01-04 | Discharge: 2020-01-04 | Disposition: A | Discharge status: Home / Self Care | Payer: 59 | Source: Ambulatory Visit | Attending: Advanced Practice Midwife | Admitting: Advanced Practice Midwife

## 2020-01-04 ENCOUNTER — Other Ambulatory Visit: Payer: Self-pay

## 2020-01-04 VITALS — BP 122/74 | Wt 208.0 lb

## 2020-01-04 DIAGNOSIS — Z3A01 Less than 8 weeks gestation of pregnancy: Secondary | ICD-10-CM

## 2020-01-04 DIAGNOSIS — Z124 Encounter for screening for malignant neoplasm of cervix: Secondary | ICD-10-CM

## 2020-01-04 DIAGNOSIS — O99210 Obesity complicating pregnancy, unspecified trimester: Secondary | ICD-10-CM

## 2020-01-04 DIAGNOSIS — Z8279 Family history of other congenital malformations, deformations and chromosomal abnormalities: Secondary | ICD-10-CM | POA: Insufficient documentation

## 2020-01-04 DIAGNOSIS — O0990 Supervision of high risk pregnancy, unspecified, unspecified trimester: Secondary | ICD-10-CM | POA: Insufficient documentation

## 2020-01-04 DIAGNOSIS — Z8269 Family history of other diseases of the musculoskeletal system and connective tissue: Secondary | ICD-10-CM

## 2020-01-04 DIAGNOSIS — Z3400 Encounter for supervision of normal first pregnancy, unspecified trimester: Secondary | ICD-10-CM

## 2020-01-04 DIAGNOSIS — Z6834 Body mass index (BMI) 34.0-34.9, adult: Secondary | ICD-10-CM

## 2020-01-04 DIAGNOSIS — Z113 Encounter for screening for infections with a predominantly sexual mode of transmission: Secondary | ICD-10-CM

## 2020-01-04 HISTORY — DX: Obesity complicating pregnancy, unspecified trimester: O99.210

## 2020-01-04 LAB — OB RESULTS CONSOLE GC/CHLAMYDIA: Gonorrhea: NEGATIVE

## 2020-01-04 NOTE — Patient Instructions (Signed)
Exercise During Pregnancy Exercise is an important part of being healthy for people of all ages. Exercise improves the function of your heart and lungs and helps you maintain strength, flexibility, and a healthy body weight. Exercise also boosts energy levels and elevates mood. Most women should exercise regularly during pregnancy. In rare cases, women with certain medical conditions or complications may be asked to limit or avoid exercise during pregnancy. How does this affect me? Along with maintaining general strength and flexibility, exercising during pregnancy can help:  Keep strength in muscles that are used during labor and childbirth.  Decrease low back pain.  Reduce symptoms of depression.  Control weight gain during pregnancy.  Reduce the risk of needing insulin if you develop diabetes during pregnancy.  Decrease the risk of cesarean delivery.  Speed up your recovery after giving birth. How does this affect my baby? Exercise can help you have a healthy pregnancy. Exercise does not cause premature birth. It will not cause your baby to weigh less at birth. What exercises can I do? Many exercises are safe for you to do during pregnancy. Do a variety of exercises that safely increase your heart and breathing rates and help you build and maintain muscle strength. Do exercises exactly as told by your health care provider. You may do these exercises:  Walking or hiking.  Swimming.  Water aerobics.  Riding a stationary bike.  Strength training.  Modified yoga or Pilates. Tell your instructor that you are pregnant. Avoid overstretching, and avoid lying on your back for long periods of time.  Running or jogging. Only choose this type of exercise if you: ? Ran or jogged regularly before your pregnancy. ? Can run or jog and still talk in complete sentences. What exercises should I avoid? Depending on your level of fitness and whether you exercised regularly before your  pregnancy, you may be told to limit high-intensity exercise. You can tell that you are exercising at a high intensity if you are breathing much harder and faster and cannot hold a conversation while exercising. You must avoid:  Contact sports.  Activities that put you at risk for falling on or being hit in the belly, such as downhill skiing, water skiing, surfing, rock climbing, cycling, gymnastics, and horseback riding.  Scuba diving.  Skydiving.  Yoga or Pilates in a room that is heated to high temperatures.  Jogging or running, unless you ran or jogged regularly before your pregnancy. While jogging or running, you should always be able to talk in full sentences. Do not run or jog so fast that you are unable to have a conversation.  Do not exercise at more than 6,000 feet above sea level (high elevation) if you are not used to exercising at high elevation. How do I exercise in a safe way?   Avoid overheating. Do not exercise in very high temperatures.  Wear loose-fitting, breathable clothes.  Avoid dehydration. Drink enough water before, during, and after exercise to keep your urine pale yellow.  Avoid overstretching. Because of hormone changes during pregnancy, it is easy to overstretch muscles, tendons, and ligaments during pregnancy.  Start slowly and ask your health care provider to recommend the types of exercise that are safe for you.  Do not exercise to lose weight. Follow these instructions at home:  Exercise on most days or all days of the week. Try to exercise for 30 minutes a day, 5 days a week, unless your health care provider tells you not to.  If   you actively exercised before your pregnancy and you are healthy, your health care provider may tell you to continue to do moderate to high-intensity exercise.  If you are just starting to exercise or did not exercise much before your pregnancy, your health care provider may tell you to do low to moderate-intensity  exercise. Questions to ask your health care provider  Is exercise safe for me?  What are signs that I should stop exercising?  Does my health condition mean that I should not exercise during pregnancy?  When should I avoid exercising during pregnancy? Stop exercising and contact a health care provider if: You have any unusual symptoms, such as:  Mild contractions of the uterus or cramps in the abdomen.  Dizziness that does not go away when you rest. Stop exercising and get help right away if: You have any unusual symptoms, such as:  Sudden, severe pain in your low back or your belly.  Mild contractions of the uterus or cramps in the abdomen that do not improve with rest and drinking fluids.  Chest pain.  Bleeding or fluid leaking from your vagina.  Shortness of breath. These symptoms may represent a serious problem that is an emergency. Do not wait to see if the symptoms will go away. Get medical help right away. Call your local emergency services (911 in the [place] Do not drive yourself to the hospital. Summary  Most women should exercise regularly throughout pregnancy. In rare cases, women with certain medical conditions or complications may be asked to limit or avoid exercise during pregnancy.  Do not exercise to lose weight during pregnancy.  Your health care provider will tell you what level of physical activity is right for you.  Stop exercising and contact a health care provider if you have mild contractions of the uterus or cramps in the abdomen. Get help right away if these contractions or cramps do not improve with rest and drinking fluids.  Stop exercising and get help right away if you have sudden, severe pain in your low back or belly, chest pain, shortness of breath, or bleeding or leaking of fluid from your vagina. This information is not intended to replace advice given to you by your health care provider. Make sure you discuss any questions you have with your  health care provider. Document Revised: 03/16/2019 Document Reviewed: 12/28/2018 Elsevier Patient Education  2020 [employer] Eating Plan for Pregnant Women While you are pregnant, your body requires additional nutrition to help support your growing baby. You also have a higher need for some vitamins and minerals, such as folic acid, calcium, iron, and vitamin D. Eating a healthy, well-balanced diet is very important for your health and your baby's health. Your need for extra calories varies for the three 3-month segments of your pregnancy (trimesters). For most women, it is recommended to consume:  150 extra calories a day during the first trimester.  300 extra calories a day during the second trimester.  300 extra calories a day during the third trimester. What are tips for following this plan?   Do not try to lose weight or go on a diet during pregnancy.  Limit your overall intake of foods that have "empty calories." These are foods that have little nutritional value, such as sweets, desserts, candies, and sugar-sweetened beverages.  Eat a variety of foods (especially fruits and vegetables) to get a full range of vitamins and minerals.  Take a prenatal vitamin to help meet your additional vitamin and mineral needs   during pregnancy, specifically for folic acid, iron, calcium, and vitamin D.  Remember to stay active. Ask your health care provider what types of exercise and activities are safe for you.  Practice good food safety and cleanliness. Wash your hands before you eat and after you prepare raw meat. Wash all fruits and vegetables well before peeling or eating. Taking these actions can help to prevent food-borne illnesses that can be very dangerous to your baby, such as listeriosis. Ask your health care provider for more information about listeriosis. What does 150 extra calories look like? Healthy options that provide 150 extra calories each day could be any of the  following:  6-8 oz (170-230 g) of plain low-fat yogurt with  cup of berries.  1 apple with 2 teaspoons (11 g) of peanut butter.  Cut-up vegetables with  cup (60 g) of hummus.  8 oz (230 mL) or 1 cup of low-fat chocolate milk.  1 stick of string cheese with 1 medium orange.  1 peanut butter and jelly sandwich that is made with one slice of whole-wheat bread and 1 tsp (5 g) of peanut butter. For 300 extra calories, you could eat two of those healthy options each day. What is a healthy amount of weight to gain? The right amount of weight gain for you is based on your BMI before you became pregnant. If your BMI:  Was less than 18 (underweight), you should gain 28-40 lb (13-18 kg).  Was 18-24.9 (normal), you should gain 25-35 lb (11-16 kg).  Was 25-29.9 (overweight), you should gain 15-25 lb (7-11 kg).  Was 30 or greater (obese), you should gain 11-20 lb (5-9 kg). What if I am having twins or multiples? Generally, if you are carrying twins or multiples:  You may need to eat 300-600 extra calories a day.  The recommended range for total weight gain is 25-54 lb (11-25 kg), depending on your BMI before pregnancy.  Talk with your health care provider to find out about nutritional needs, weight gain, and exercise that is right for you. What foods can I eat?  Fruits All fruits. Eat a variety of colors and types of fruit. Remember to wash your fruits well before peeling or eating. Vegetables All vegetables. Eat a variety of colors and types of vegetables. Remember to wash your vegetables well before peeling or eating. Grains All grains. Choose whole grains, such as whole-wheat bread, oatmeal, or brown rice. Meats and other protein foods Lean meats, including chicken, turkey, fish, and lean cuts of beef, veal, or pork. If you eat fish or seafood, choose options that are higher in omega-3 fatty acids and lower in mercury, such as salmon, herring, mussels, trout, sardines, pollock,  shrimp, crab, and lobster. Tofu. Tempeh. Beans. Eggs. Peanut butter and other nut butters. Make sure that all meats, poultry, and eggs are cooked to food-safe temperatures or "well-done." Two or more servings of fish are recommended each week in order to get the most benefits from omega-3 fatty acids that are found in seafood. Choose fish that are lower in mercury. You can find more information online:  www.fda.gov Dairy Pasteurized milk and milk alternatives (such as almond milk). Pasteurized yogurt and pasteurized cheese. Cottage cheese. Sour cream. Beverages Water. Juices that contain 100% fruit juice or vegetable juice. Caffeine-free teas and decaffeinated coffee. Drinks that contain caffeine are okay to drink, but it is better to avoid caffeine. Keep your total caffeine intake to less than 200 mg each day (which is 12 oz   or 355 mL of coffee, tea, or soda) or the limit as told by your health care provider. Fats and oils Fats and oils are okay to include in moderation. Sweets and desserts Sweets and desserts are okay to include in moderation. Seasoning and other foods All pasteurized condiments. The items listed above may not be a complete list of foods and beverages you can eat. Contact a dietitian for more information. What foods are not recommended? Fruits Unpasteurized fruit juices. Vegetables Raw (unpasteurized) vegetable juices. Meats and other protein foods Lunch meats, bologna, hot dogs, or other deli meats. (If you must eat those meats, reheat them until they are steaming hot.) Refrigerated pat, meat spreads from a meat counter, smoked seafood that is found in the refrigerated section of a store. Raw or undercooked meats, poultry, and eggs. Raw fish, such as sushi or sashimi. Fish that have high mercury content, such as tilefish, shark, swordfish, and king mackerel. To learn more about mercury in fish, talk with your health care provider or look for online resources, such  as:  www.fda.gov Dairy Raw (unpasteurized) milk and any foods that have raw milk in them. Soft cheeses, such as feta, queso blanco, queso fresco, Brie, Camembert cheeses, blue-veined cheeses, and Panela cheese (unless it is made with pasteurized milk, which must be stated on the label). Beverages Alcohol. Sugar-sweetened beverages, such as sodas, teas, or energy drinks. Seasoning and other foods Homemade fermented foods and drinks, such as pickles, sauerkraut, or kombucha drinks. (Store-bought pasteurized versions of these are okay.) Salads that are made in a store or deli, such as ham salad, chicken salad, egg salad, tuna salad, and seafood salad. The items listed above may not be a complete list of foods and beverages you should avoid. Contact a dietitian for more information. Where to find more information To calculate the number of calories you need based on your height, weight, and activity level, you can use an online calculator such as:  www.choosemyplate.gov/MyPlatePlan To calculate how much weight you should gain during pregnancy, you can use an online pregnancy weight gain calculator such as:  www.choosemyplate.gov/pregnancy-weight-gain-calculator Summary  While you are pregnant, your body requires additional nutrition to help support your growing baby.  Eat a variety of foods, especially fruits and vegetables to get a full range of vitamins and minerals.  Practice good food safety and cleanliness. Wash your hands before you eat and after you prepare raw meat. Wash all fruits and vegetables well before peeling or eating. Taking these actions can help to prevent food-borne illnesses, such as listeriosis, that can be very dangerous to your baby.  Do not eat raw meat or fish. Do not eat fish that have high mercury content, such as tilefish, shark, swordfish, and king mackerel. Do not eat unpasteurized (raw) dairy.  Take a prenatal vitamin to help meet your additional vitamin and  mineral needs during pregnancy, specifically for folic acid, iron, calcium, and vitamin D. This information is not intended to replace advice given to you by your health care provider. Make sure you discuss any questions you have with your health care provider. Document Revised: 04/13/2019 Document Reviewed: 08/20/2017 Elsevier Patient Education  2020 Else[employer] Inc. Prenatal Care Prenatal care is health care during pregnancy. It helps you and your unborn baby (fetus) stay as healthy as possible. Prenatal care may be provided by a midwife, a family practice health care provider, or a childbirth and pregnancy specialist (obstetrician). How does this affect me? During pregnancy, you will be closely monitored   for any new conditions that might develop. To lower your risk of pregnancy complications, you and your health care provider will talk about any underlying conditions you have. How does this affect my baby? Early and consistent prenatal care increases the chance that your baby will be healthy during pregnancy. Prenatal care lowers the risk that your baby will be:  Born early (prematurely).  Smaller than expected at birth (small for gestational age). What can I expect at the first prenatal care visit? Your first prenatal care visit will likely be the longest. You should schedule your first prenatal care visit as soon as you know that you are pregnant. Your first visit is a good time to talk about any questions or concerns you have about pregnancy. At your visit, you and your health care provider will talk about:  Your medical history, including: ? Any past pregnancies. ? Your family's medical history. ? The baby's father's medical history. ? Any long-term (chronic) health conditions you have and how you manage them. ? Any surgeries or procedures you have had. ? Any current over-the-counter or prescription medicines, herbs, or supplements you are taking.  Other factors that could pose a risk  to your baby, including:  Your home setting and your stress levels, including: ? Exposure to abuse or violence. ? Household financial strain. ? Mental health conditions you have.  Your daily health habits, including diet and exercise. Your health care provider will also:  Measure your weight, height, and blood pressure.  Do a physical exam, including a pelvic and breast exam.  Perform blood tests and urine tests to check for: ? Urinary tract infection. ? Sexually transmitted infections (STIs). ? Low iron levels in your blood (anemia). ? Blood type and certain proteins on red blood cells (Rh antibodies). ? Infections and immunity to viruses, such as hepatitis B and rubella. ? HIV (human immunodeficiency virus).  Do an ultrasound to confirm your baby's growth and development and to help predict your estimated due date (EDD). This ultrasound is done with a probe that is inserted into the vagina (transvaginal ultrasound).  Discuss your options for genetic screening.  Give you information about how to keep yourself and your baby healthy, including: ? Nutrition and taking vitamins. ? Physical activity. ? How to manage pregnancy symptoms such as nausea and vomiting (morning sickness). ? Infections and substances that may be harmful to your baby and how to avoid them. ? Food safety. ? Dental care. ? Working. ? Travel. ? Warning signs to watch for and when to call your health care provider. How often will I have prenatal care visits? After your first prenatal care visit, you will have regular visits throughout your pregnancy. The visit schedule is often as follows:  Up to week 28 of pregnancy: once every 4 weeks.  28-36 weeks: once every 2 weeks.  After 36 weeks: every week until delivery. Some women may have visits more or less often depending on any underlying health conditions and the health of the baby. Keep all follow-up and prenatal care visits as told by your health care  provider. This is important. What happens during routine prenatal care visits? Your health care provider will:  Measure your weight and blood pressure.  Check for fetal heart sounds.  Measure the height of your uterus in your abdomen (fundal height). This may be measured starting around week 20 of pregnancy.  Check the position of your baby inside your uterus.  Ask questions about your diet, sleeping patterns, and   whether you can feel the baby move.  Review warning signs to watch for and signs of labor.  Ask about any pregnancy symptoms you are having and how you are dealing with them. Symptoms may include: ? Headaches. ? Nausea and vomiting. ? Vaginal discharge. ? Swelling. ? Fatigue. ? Constipation. ? Any discomfort, including back or pelvic pain. Make a list of questions to ask your health care provider at your routine visits. What tests might I have during prenatal care visits? You may have blood, urine, and imaging tests throughout your pregnancy, such as:  Urine tests to check for glucose, protein, or signs of infection.  Glucose tests to check for a form of diabetes that can develop during pregnancy (gestational diabetes mellitus). This is usually done around week 24 of pregnancy.  An ultrasound to check your baby's growth and development and to check for birth defects. This is usually done around week 20 of pregnancy.  A test to check for group B strep (GBS) infection. This is usually done around week 36 of pregnancy.  Genetic testing. This may include blood or imaging tests, such as an ultrasound. Some genetic tests are done during the first trimester and some are done during the second trimester. What else can I expect during prenatal care visits? Your health care provider may recommend getting certain vaccines during pregnancy. These may include:  A yearly flu shot (annual influenza vaccine). This is especially important if you will be pregnant during flu  season.  Tdap (tetanus, diphtheria, pertussis) vaccine. Getting this vaccine during pregnancy can protect your baby from whooping cough (pertussis) after birth. This vaccine may be recommended between weeks 27 and 36 of pregnancy. Later in your pregnancy, your health care provider may give you information about:  Childbirth and breastfeeding classes.  Choosing a health care provider for your baby.  Umbilical cord banking.  Breastfeeding.  Birth control after your baby is born.  The hospital labor and delivery unit and how to tour it.  Registering at the hospital before you go into labor. Where to find more information  Office on Women's Health: womenshealth.gov  American Pregnancy Association: americanpregnancy.org  March of Dimes: marchofdimes.org Summary  Prenatal care helps you and your baby stay as healthy as possible during pregnancy.  Your first prenatal care visit will most likely be the longest.  You will have visits and tests throughout your pregnancy to monitor your health and your baby's health.  Bring a list of questions to your visits to ask your health care provider.  Make sure to keep all follow-up and prenatal care visits with your health care provider. This information is not intended to replace advice given to you by your health care provider. Make sure you discuss any questions you have with your health care provider. Document Revised: 03/15/2019 Document Reviewed: 11/22/2017 Elsevier Patient Education  2020 Elsevier Inc.     COVID-19 and Your Pregnancy FAQ  How can I prevent infection with COVID-19 during my pregnancy? Social distancing is key. Please limit any interactions in public. Try and work from home if possible. Frequently wash your hands after touching possibly contaminated surfaces. Avoid touching your face.  Minimize trips to the store. Consider online ordering when possible.   Should I wear a mask? YES. It is recommended by the CDC  that all people wear a cloth mask or facial covering in public. You should wear a mask to your visits in the office. This will help reduce transmission as well as your   risk or acquiring COVID-19. New studies are showing that even asymptomatic individuals can spread the virus from talking.   Where can I get a mask? [place] and the city of [place] are partnering to provide masks to community members. You can pick up a mask from several locations. This website also has instructions about how to make a mask by sewing or without sewing by using a t-shirt or bandana.  https://www.[place].gov/i-want-to/learn-about/covid-19-information-and-updates/covid-19-face-mask-project  Studies have shown that if you were a tube or nylon stocking from pantyhose over a cloth mask it makes the cloth mask almost as effective as a N95 mask.  https://www.npr.org/sections/goatsandsoda/2019/03/29/840146830/adding-a-nylon-stocking-layer-could-boost-protection-from-cloth-masks-study-find  What are the symptoms of COVID-19? Fever (greater than 100.4 F), dry cough, shortness of breath.  Am I more at risk for COVID-19 since I am pregnant? There is not currently data showing that pregnant women are more adversely impacted by COVID-19 than the general population. However, we know that pregnant women tend to have worse respiratory complications from similar diseases such as the flu and SARS and for this reason should be considered an at-risk population.  What do I do if I am experiencing the symptoms of COVID-19? Testing is being limited because of test availability. If you are experiencing symptoms you should quarantine yourself, and the members of your family, for at least 2 weeks at home.   Please visit this website for more information: https://www.cdc.gov/coronavirus/2019-ncov/if-you-are-sick/steps-when-sick.html  When should I go to the Emergency Room? Please go to the emergency room if you are experiencing  ANY of these symptoms*:  1.    Difficulty breathing or shortness of breath 2.    Persistent pain or pressure in the chest 3.    Confusion or difficulty being aroused (or awakened) 4.    Bluish lips or face  *This list is not all inclusive. Please consult our office for any other symptoms that are severe or concerning.  What do I do if I am having difficulty breathing? You should go to the Emergency Room for evaluation. At this time they have a tent set up for evaluating patients with COVID-19 symptoms.   How will my prenatal care be different because of the COVID-19 pandemic? It has been recommended to reduce the frequency of face-to-face visits and use resources such as telephone and virtual visits when possible. Using a scale, blood pressure machine and fetal doppler at home can further help reduce face-to-face visits. You will be provided with additional information on this topic.  We ask that you come to your visits alone to minimize potential exposures to  COVID-19.  How can I receive childbirth education? At this time in-person classes have been cancelled. You can register for online childbirth education, breastfeeding, and newborn care classes.  Please visit:  [email or web address] for more information  How will my hospital birth experience be different? The hospital is currently limiting visitors. This means that while you are in labor you can only have one person at the hospital with you. Additional family members will not be allowed to wait in the building or outside your room. Your one support person can be the father of the baby, a relative, a doula, or a friend. Once one support person is designated that person will wear a band. This band cannot be shared with multiple people.  Nitrous Gas is not being offered for pain relief since the tubing and filter for the machine can not be sanitized in a way to guarantee prevention of transmission of COVID-19.  Nasal cannula use of    oxygen for fetal indications has also been discontinued.  Currently a clear plastic sheet is being hung between mom and the delivering provider during pushing and delivery to help prevent transmission of COVID-19.      How long will I stay in the hospital for after giving birth? It is also recommended that discharge home be expedited during the COVID-19 outbreak. This means staying for 1 day after a vaginal delivery and 2 days after a cesarean section. Patients who need to stay longer for medical reasons are allowed to do so, but the goal will be for expedited discharge home.   What if I have COVID-19 and I am in labor? We ask that you wear a mask while on labor and delivery. We will try and accommodate you being placed in a room that is capable of filtering the air. Please call ahead if you are in labor and on your way to the hospital. The phone number for labor and delivery at [facility] is [phone].  If I have COVID-19 when my baby is born how can I prevent my baby from contracting COVID-19? This is an issue that will have to be discussed on a case-by-case basis. Current recommendations suggest providing separate isolation rooms for both the mother and new infant as well as limiting visitors. However, there are practical challenges to this recommendation. The situation will assuredly change and decisions will be influenced by the desires of the mother and availability of space.  Some suggestions are the use of a curtain or physical barrier between mom and infant, hand hygiene, mom wearing a mask, or 6 feet of spacing between a mom and infant.   Can I breastfeed during the COVID-19 pandemic?   Yes, breastfeeding is encouraged.  Can I breastfeed if I have COVID-19? Yes. Covid-19 has not been found in breast milk. This means you cannot give COVID-19 to your child through breast milk. Breast feeding will also help pass antibodies to fight infection to your baby.   What  precautions should I take when breastfeeding if I have COVID-19? If a mother and newborn do room-in and the mother wishes to feed at the breast, she should put on a facemask and practice hand hygiene before each feeding.  What precautions should I take when pumping if I have COVID-19? Prior to expressing breast milk, mothers should practice hand hygiene. After each pumping session, all parts that come into contact with breast milk should be thoroughly washed and the entire pump should be appropriately disinfected per the manufacturer's instructions. This expressed breast milk should be fed to the newborn by a healthy caregiver.  What if I am pregnant and work in healthcare? Based on limited data regarding COVID-19 and pregnancy, ACOG currently does not propose creating additional restrictions on pregnant health care personnel because of COVID-19 alone. Pregnant women do not appear to be at higher risk of severe disease related to COVID-19. Pregnant health care personnel should follow CDC risk assessment and infection control guidelines for health care personnel exposed to patients with suspected or confirmed COVID-19. Adherence to recommended infection prevention and control practices is an important part of protecting all health care personnel in health care settings.    Information on COVID-19 in pregnancy is very limited; however, facilities may want to consider limiting exposure of pregnant health care personnel to patients with confirmed or suspected COVID-19 infection, especially during higher-risk procedures (eg, aerosol-generating procedures), if feasible, based on staffing availability.    

## 2020-01-04 NOTE — Progress Notes (Signed)
New Obstetric Patient H&P    Chief Complaint: "Desires prenatal care"   History of Present Illness: Patient is a 24 y.o. G1P0000 Not Hispanic or Latino female, presents with amenorrhea and positive home pregnancy test. Patient's last menstrual period was 11/18/2019 (exact date). and based on her  LMP, her EDD is Estimated Date of Delivery: 08/24/20 and her EGA is [redacted]w[redacted]d. Cycles are 5. days, regular, and occur approximately every : 28 days. Her last pap smear was 3 years ago and was no abnormalities.    She had a urine pregnancy test which was positive 2 or 3 week(s)  ago. Her last menstrual period was normal and lasted for  5 day(s). Since her LMP she claims she has experienced breast tenderness, fatigue. She denies vaginal bleeding. Her past medical history is noncontributory. This is her first pregnancy.  Since her LMP, she admits to the use of tobacco products  no She claims she has gained   23 pounds since the start of her pregnancy.  There are cats in the home in the home  yes If yes Indoor She admits close contact with children on a regular basis  no  She has had chicken pox in the past no She has had Tuberculosis exposures, symptoms, or previously tested positive for TB   no Current or past history of domestic violence. no  Genetic Screening/Teratology Counseling: (Includes patient, baby's father, or anyone in either family with:)   1. Patient's age >/= 14 at [facility]  no 2. Thalassemia ([place], [place], Mediterranean, or Asian background): MCV<80  no 3. Neural tube defect (meningomyelocele, spina bifida, anencephaly)  no 4. Congenital heart defect  no  5. Down syndrome  no 6. Tay-Sachs (Jewish, [place]  no 7. Canavan's Disease  no 8. Sickle cell disease or trait (African)  no  9. Hemophilia or other blood disorders  no  10. Muscular dystrophy  no  11. Cystic fibrosis  no  12. Huntington's Chorea  no  13. Mental retardation/autism  no 14. Other inherited genetic or  chromosomal disorder  MOB family history of osteogenesis imperfecta- patient does not have 15. Maternal metabolic disorder (DM, PKU, etc)  no 16. Patient or FOB with a child with a birth defect not listed above no  16a. Patient or FOB with a birth defect themselves no 17. Recurrent pregnancy loss, or stillbirth  no  18. Any medications since LMP other than prenatal vitamins (include vitamins, supplements, OTC meds, drugs, alcohol)  no 19. Any other genetic/environmental exposure to discuss  no  Infection History:   1. Lives with someone with TB or TB exposed  no  2. Patient or partner has history of genital herpes  no 3. Rash or viral illness since LMP  no 4. History of STI (GC, CT, HPV, syphilis, HIV)  no 5. History of recent travel :  no  Other pertinent information:  no     Review of Systems:10 point review of systems negative unless otherwise noted in HPI  Past Medical History:  Past Medical History:  Diagnosis Date  . Vitamin D deficiency     Past Surgical History:  Past Surgical History:  Procedure Laterality Date  . NO PAST SURGERIES      Gynecologic History: Patient's last menstrual period was 11/18/2019 (exact date).  Obstetric History: G1P0000  Family History:  Family History  Problem Relation Age of Onset  . Cancer Maternal Grandmother   . Diabetes Paternal Grandmother     Social History:  Social History   Socioeconomic History  . Marital status: Single    Spouse name: Not on file  . Number of children: Not on file  . Years of education: Not on file  . Highest education level: Not on file  Occupational History  . Not on file  Tobacco Use  . Smoking status: Never Smoker  . Smokeless tobacco: Never Used  Substance and Sexual Activity  . Alcohol use: No  . Drug use: No  . Sexual activity: Yes    Birth control/protection: None  Other Topics Concern  . Not on file  Social History Narrative  . Not on file   Social Determinants of Health    Financial Resource Strain:   . Difficulty of Paying Living Expenses: Not on file  Food Insecurity:   . Worried About Programme researcher, broadcasting/film/video in the Last Year: Not on file  . Ran Out of Food in the Last Year: Not on file  Transportation Needs:   . Lack of Transportation (Medical): Not on file  . Lack of Transportation (Non-Medical): Not on file  Physical Activity:   . Days of Exercise per Week: Not on file  . Minutes of Exercise per Session: Not on file  Stress:   . Feeling of Stress : Not on file  Social Connections:   . Frequency of Communication with Friends and Family: Not on file  . Frequency of Social Gatherings with Friends and Family: Not on file  . Attends Religious Services: Not on file  . Active Member of Clubs or Organizations: Not on file  . Attends Banker Meetings: Not on file  . Marital Status: Not on file  Intimate Partner Violence:   . Fear of Current or Ex-Partner: Not on file  . Emotionally Abused: Not on file  . Physically Abused: Not on file  . Sexually Abused: Not on file    Allergies:  No Known Allergies  Medications: Prior to Admission medications   Medication Sig Start Date End Date Taking? Authorizing Provider  Prenatal Vit-Fe Fumarate-FA (MULTIVITAMIN-PRENATAL) 27-0.8 MG TABS tablet Take 1 tablet by mouth daily at 12 noon.   Yes [provider]    Physical Exam Vitals: Blood pressure 122/74, weight 208 lb (94.3 kg), last menstrual period 11/18/2019.  General: NAD HEENT: normocephalic, anicteric Thyroid: no enlargement, no palpable nodules Pulmonary: No increased work of breathing, CTAB Cardiovascular: RRR, distal pulses 2+ Abdomen: NABS, soft, non-tender, non-distended.  Umbilicus without lesions.  No hepatomegaly, splenomegaly or masses palpable. No evidence of hernia  Genitourinary:  External: Normal external female genitalia.  Normal urethral meatus, normal  Bartholin's and Skene's glands.    Vagina: Normal vaginal  mucosa, no evidence of prolapse.    Cervix: Grossly normal in appearance, no bleeding, no CMT  Uterus: Non-enlarged, mobile, normal contour.    Adnexa: ovaries non-enlarged, no adnexal masses  Rectal: deferred Extremities: no edema, erythema, or tenderness Neurologic: Grossly intact Psychiatric: mood appropriate, affect full  The following were addressed during this visit:  Breastfeeding Education - Early initiation of breastfeeding    Comments: Keeps milk supply adequate, helps contract uterus and slow bleeding, and early milk is the perfect first food and is easy to digest.   - The importance of exclusive breastfeeding    Comments: Provides antibodies, Lower risk of breast and ovarian cancers, and type-2 diabetes,Helps your body recover, Reduced chance of SIDS.   - Risks of giving your baby anything other than breast milk if you are breastfeeding  Comments: Make the baby less content with breastfeeds, may make my baby more susceptible to illness, and may reduce my milk supply.   - The importance of early skin-to-skin contact    Comments: Keeps baby warm and secure, helps keep baby's blood sugar up and breathing steady, easier to bond and breastfeed, and helps calm baby.  - Effective positioning and attachment    Comments: Helps my baby to get enough breast milk, helps to produce an adequate milk supply, and helps prevent nipple pain and damage   - Exclusive breastfeeding for the first 6 months    Comments: Builds a healthy milk supply and keeps it up, protects baby from sickness and disease, and breastmilk has everything your baby needs for the first 6 months.    Assessment: 24 y.o. G1P0000 at [redacted]w[redacted]d by LMP presenting to initiate prenatal care  Plan: 1) Avoid alcoholic beverages. 2) Patient encouraged not to smoke.  3) Discontinue the use of all non-medicinal drugs and chemicals.  4) Take prenatal vitamins daily.  5) Nutrition, food safety (fish, cheese advisories, and  high nitrite foods) and exercise discussed. 6) Hospital and practice style discussed with cross coverage system.  7) Genetic Screening, such as with 1st Trimester Screening, cell free fetal DNA, AFP testing, and Ultrasound, as well as with amniocentesis and CVS as appropriate, is discussed with patient. At the conclusion of today's visit patient requested cell free DNA genetic testing/Inheritest 8) Patient is asked about travel to areas at risk for the Zika virus, and counseled to avoid travel and exposure to mosquitoes or sexual partners who may have themselves been exposed to the virus. Testing is discussed, and will be ordered as appropriate.  9) PAPtima, urine culture today 10) Return in 2 weeks for dating scan, NOB panel, Sickle Cell screen, early 1 hr gtt 11) MaterniT 21/Inheritest at 10+ weeks   Rod Can, [facility] 01/04/2020, 12:21 PM

## 2020-01-04 NOTE — Lactation Note (Signed)
Lactation Consultation Note  Patient Name: Doris Thomas YFVCB'S Date: 01/04/2020     Maternal Data    Feeding    LATCH Score                   Interventions    Lactation Tools Discussed/Used     Consult Status  Lactation consultant discussed benefits of breastfeeding per the Ready, Set, Baby curriculum. [name] encouraged to review breastfeeding information on Ready, Set, Baby website and given information for virtual breastfeeding classes. She denies any questions or concerns at this time.    Arlyss Gandy 01/04/2020, 9:56 AM

## 2020-01-04 NOTE — Progress Notes (Signed)
NOB today. LMP: 11/22/2019

## 2020-01-05 LAB — CYTOLOGY - PAP
Chlamydia: NEGATIVE
Comment: NEGATIVE
Comment: NEGATIVE
Comment: NORMAL
Diagnosis: NEGATIVE
Neisseria Gonorrhea: NEGATIVE
Trichomonas: NEGATIVE

## 2020-01-06 ENCOUNTER — Ambulatory Visit (INDEPENDENT_AMBULATORY_CARE_PROVIDER_SITE_OTHER)
Admission: RE | Admit: 2020-01-06 | Discharge: 2020-01-06 | Disposition: A | Discharge status: Home / Self Care | Payer: 59 | Source: Ambulatory Visit

## 2020-01-06 DIAGNOSIS — Z0489 Encounter for examination and observation for other specified reasons: Secondary | ICD-10-CM | POA: Diagnosis not present

## 2020-01-06 DIAGNOSIS — Z711 Person with feared health complaint in whom no diagnosis is made: Secondary | ICD-10-CM | POA: Diagnosis not present

## 2020-01-06 DIAGNOSIS — Z712 Person consulting for explanation of examination or test findings: Secondary | ICD-10-CM | POA: Diagnosis not present

## 2020-01-06 DIAGNOSIS — Z3A00 Weeks of gestation of pregnancy not specified: Secondary | ICD-10-CM

## 2020-01-06 DIAGNOSIS — Z3490 Encounter for supervision of normal pregnancy, unspecified, unspecified trimester: Secondary | ICD-10-CM

## 2020-01-06 LAB — URINE CULTURE

## 2020-01-06 NOTE — ED Provider Notes (Signed)
Virtual Visit via Video Note:  Doris Thomas  initiated request for Telemedicine visit with [facility] team. I connected with Doris Thomas  on 01/06/2020 at 12:18 PM  for a synchronized telemedicine visit using a video enabled HIPPA compliant telemedicine application. I verified that I am speaking with Doris Thomas  using two identifiers. Jaynee Eagles, PA-C  was physically located in a [facility] Urgent care site and Doris Thomas was located at a different location.   The limitations of evaluation and management by telemedicine as well as the availability of in-person appointments were discussed. Patient was informed that she  may incur a bill ( including co-pay) for this virtual visit encounter. Doris Thomas  expressed understanding and gave verbal consent to proceed with virtual visit.    History of Present Illness:Doris Thomas  is a 24 y.o. female presents with concerns about an UTI.  Patient had an office visit on 01/04/2020 with her OB for general checkup.  She had a Pap smear, STI testing and urine culture.  Everything resulted negative.  However, patient had concerns about what mixed urogenital flora meant and if she needed treatment for an UTI.  Denies any kinds of urinary tract symptoms including dysuria, urinary frequency, hematuria, fevers, pelvic pain.   ROS  Current Outpatient Medications  Medication Sig Dispense Refill  . Prenatal Vit-Fe Fumarate-FA (MULTIVITAMIN-PRENATAL) 27-0.8 MG TABS tablet Take 1 tablet by mouth daily at 12 noon.     No current facility-administered medications for this encounter.     No Known Allergies   Past Medical History:  Diagnosis Date  . Vitamin D deficiency     Past Surgical History:  Procedure Laterality Date  . NO PAST SURGERIES        Observations/Objective: Physical Exam Constitutional:      General: She is not in acute distress.    Appearance: Normal appearance. She is well-developed. She is not ill-appearing,  toxic-appearing or diaphoretic.  Eyes:     Extraocular Movements: Extraocular movements intact.  Pulmonary:     Effort: Pulmonary effort is normal.  Neurological:     General: No focal deficit present.     Mental Status: She is alert and oriented to person, place, and time.  Psychiatric:        Mood and Affect: Mood normal.        Behavior: Behavior normal.        Thought Content: Thought content normal.        Judgment: Judgment normal.      Assessment and Plan:  1. Worried well   2. Pregnancy, unspecified gestational age    Reviewed results with patient including negative urine culture.  Reassured patient that she does not need antibiotics at this time.  But counseled that if she develops any UTI symptoms, to present to our clinic for a urinalysis, urine culture.  Keep follow-up with PCP and OB.   Follow Up Instructions:    I discussed the assessment and treatment plan with the patient. The patient was provided an opportunity to ask questions and all were answered. The patient agreed with the plan and demonstrated an understanding of the instructions.   The patient was advised to call back or seek an in-person evaluation if the symptoms worsen or if the condition fails to improve as anticipated.  I provided 10 minutes of non-face-to-face time during this encounter.    Jaynee Eagles, PA-C  01/06/2020 12:18 PM  Wallis Bamberg, PA-C 01/06/20 1228

## 2020-01-09 ENCOUNTER — Telehealth: Payer: Self-pay | Admitting: Family Medicine

## 2020-01-22 ENCOUNTER — Encounter: Payer: Self-pay | Admitting: Obstetrics & Gynecology

## 2020-01-22 ENCOUNTER — Encounter: Payer: Self-pay | Admitting: Certified Nurse Midwife

## 2020-01-22 ENCOUNTER — Other Ambulatory Visit: Payer: 59

## 2020-01-22 ENCOUNTER — Ambulatory Visit (INDEPENDENT_AMBULATORY_CARE_PROVIDER_SITE_OTHER): Payer: 59

## 2020-01-22 ENCOUNTER — Ambulatory Visit (INDEPENDENT_AMBULATORY_CARE_PROVIDER_SITE_OTHER): Payer: 59 | Admitting: Obstetrics & Gynecology

## 2020-01-22 ENCOUNTER — Other Ambulatory Visit: Payer: Self-pay

## 2020-01-22 VITALS — BP 130/80 | Wt 211.0 lb

## 2020-01-22 DIAGNOSIS — Z3401 Encounter for supervision of normal first pregnancy, first trimester: Secondary | ICD-10-CM

## 2020-01-22 DIAGNOSIS — Z3689 Encounter for other specified antenatal screening: Secondary | ICD-10-CM | POA: Diagnosis not present

## 2020-01-22 DIAGNOSIS — Z3400 Encounter for supervision of normal first pregnancy, unspecified trimester: Secondary | ICD-10-CM

## 2020-01-22 DIAGNOSIS — O99210 Obesity complicating pregnancy, unspecified trimester: Secondary | ICD-10-CM

## 2020-01-22 DIAGNOSIS — Z3A09 9 weeks gestation of pregnancy: Secondary | ICD-10-CM | POA: Diagnosis not present

## 2020-01-22 DIAGNOSIS — Z6834 Body mass index (BMI) 34.0-34.9, adult: Secondary | ICD-10-CM

## 2020-01-22 LAB — OB RESULTS CONSOLE VARICELLA ZOSTER ANTIBODY, IGG: Varicella: NON-IMMUNE/NOT IMMUNE

## 2020-01-22 MED ORDER — CONCEPT DHA 53.5-38-1 MG PO CAPS: 1.0000 | ORAL_CAPSULE | Freq: Every day | ORAL | 9 refills | Status: AC

## 2020-01-22 NOTE — Patient Instructions (Signed)

## 2020-01-22 NOTE — Progress Notes (Signed)
  Subjective  No nausea or pain  Objective  BP 130/80   Wt 211 lb (95.7 kg)   LMP 11/18/2019 (Exact Date)   BMI 35.11 kg/m  General: NAD Pumonary: no increased work of breathing Abdomen: gravid, non-tender Extremities: no edema Psychiatric: mood appropriate, affect full  Assessment  24 y.o. G1P0000 at 100w2d by  08/24/2020, by Last Menstrual Period presenting for routine prenatal visit  Plan   Problem List Items Addressed This Visit      Other   Supervision of normal first pregnancy, antepartum    Other Visit Diagnoses    [redacted] weeks gestation of pregnancy    -  Primary      pregnancy Problems (from 01/04/20 to present)    Problem Noted Resolved   Family history of osteogenesis imperfecta 01/04/2020 by Tresea Mall, CNM No     Review of ULTRASOUND.    I have personally reviewed images and report of recent ultrasound done at [facility].    Plan of management to be discussed with patient.    Keep EDC  PNV eRx Labs today for Glucola, prenatals NIPT nv  Annamarie Major, MD, [facility], [facility] 01/22/2020  11:33 AM

## 2020-01-23 LAB — RPR+RH+ABO+RUB AB+AB SCR+CB...
Antibody Screen: NEGATIVE
HIV Screen 4th Generation wRfx: NONREACTIVE
Hematocrit: 39.9 % (ref 34.0–46.6)
Hemoglobin: 12.8 g/dL (ref 11.1–15.9)
Hepatitis B Surface Ag: NEGATIVE
MCH: 26.7 pg (ref 26.6–33.0)
MCHC: 32.1 g/dL (ref 31.5–35.7)
MCV: 83 fL (ref 79–97)
Platelets: 332 10*3/uL (ref 150–450)
RBC: 4.8 x10E6/uL (ref 3.77–5.28)
RDW: 18.7 % — ABNORMAL HIGH (ref 11.7–15.4)
RPR Ser Ql: NONREACTIVE
Rh Factor: POSITIVE
Rubella Antibodies, IGG: 15 index (ref >0.99)
Varicella zoster IgG: <135 index — ABNORMAL LOW (ref >165)
WBC: 8.4 10*3/uL (ref 3.4–10.8)

## 2020-01-23 LAB — HEMOGLOBINOPATHY EVALUATION
HGB C: 0 %
HGB S: 0 %
HGB VARIANT: 0 %
Hemoglobin A2 Quantitation: 2 % (ref 1.8–3.2)
Hemoglobin F Quantitation: 0 % (ref 0.0–2.0)
Hgb A: 98 % (ref 96.4–98.8)

## 2020-01-23 LAB — GLUCOSE, 1 HOUR GESTATIONAL: Gestational Diabetes Screen: 108 mg/dL (ref 65–139)

## 2020-02-05 ENCOUNTER — Ambulatory Visit (INDEPENDENT_AMBULATORY_CARE_PROVIDER_SITE_OTHER): Payer: Self-pay | Admitting: Obstetrics and Gynecology

## 2020-02-05 ENCOUNTER — Other Ambulatory Visit: Payer: Self-pay

## 2020-02-05 VITALS — BP 128/84 | Wt 211.0 lb

## 2020-02-05 DIAGNOSIS — Z8269 Family history of other diseases of the musculoskeletal system and connective tissue: Secondary | ICD-10-CM

## 2020-02-05 DIAGNOSIS — Z1379 Encounter for other screening for genetic and chromosomal anomalies: Secondary | ICD-10-CM

## 2020-02-05 DIAGNOSIS — O99210 Obesity complicating pregnancy, unspecified trimester: Secondary | ICD-10-CM

## 2020-02-05 DIAGNOSIS — O99211 Obesity complicating pregnancy, first trimester: Secondary | ICD-10-CM

## 2020-02-05 DIAGNOSIS — Z3A11 11 weeks gestation of pregnancy: Secondary | ICD-10-CM

## 2020-02-05 DIAGNOSIS — Z3401 Encounter for supervision of normal first pregnancy, first trimester: Secondary | ICD-10-CM

## 2020-02-05 DIAGNOSIS — Z3400 Encounter for supervision of normal first pregnancy, unspecified trimester: Secondary | ICD-10-CM

## 2020-02-05 DIAGNOSIS — Z8279 Family history of other congenital malformations, deformations and chromosomal abnormalities: Secondary | ICD-10-CM

## 2020-02-05 DIAGNOSIS — Z31438 Encounter for other genetic testing of female for procreative management: Secondary | ICD-10-CM

## 2020-02-05 LAB — POCT URINALYSIS DIPSTICK OB
Glucose, UA: NEGATIVE
POC,PROTEIN,UA: NEGATIVE

## 2020-02-05 NOTE — Progress Notes (Signed)
    Routine Prenatal Care Visit  Subjective  Doris Thomas is a 24 y.o. G1P0000 at [redacted]w[redacted]d being seen today for ongoing prenatal care.  She is currently monitored for the following issues for this low-risk pregnancy and has Vitamin D deficiency; Encounter for contraceptive management; Supervision of normal first pregnancy, antepartum; Family history of osteogenesis imperfecta; and Obesity affecting pregnancy, antepartum on their problem list.  ----------------------------------------------------------------------------------- Patient reports no complaints.   Contractions: Not present. Vag. Bleeding: None.  Movement: Absent. Denies leaking of fluid.  ----------------------------------------------------------------------------------- The following portions of the patient's history were reviewed and updated as appropriate: allergies, current medications, past family history, past medical history, past social history, past surgical history and problem list. Problem list updated.   Objective  Blood pressure 128/84, weight 211 lb (95.7 kg), last menstrual period 11/18/2019. Pregravid weight 185 lb (83.9 kg) Total Weight Gain 26 lb (11.8 kg) Urinalysis:      Fetal Status: Fetal Heart Rate (bpm): 172   Movement: Absent     General:  Alert, oriented and cooperative. Patient is in no acute distress.  Skin: Skin is warm and dry. No rash noted.   Cardiovascular: Normal heart rate noted  Respiratory: Normal respiratory effort, no problems with respiration noted  Abdomen: Soft, gravid, appropriate for gestational age. Pain/Pressure: Absent     Pelvic:  Cervical exam deferred        Extremities: Normal range of motion.     ental Status: Normal mood and affect. Normal behavior. Normal judgment and thought content.     Assessment   24 y.o. G1P0000 at [redacted]w[redacted]d by  08/24/2020, by Last Menstrual Period presenting for routine prenatal visit  Plan   pregnancy Problems (from 01/04/20 to present)    Problem  Noted Resolved   Supervision of normal first pregnancy, antepartum 01/04/2020 by Tresea Mall, CNM No   Overview Addendum 02/05/2020 10:58 AM by Vena [place], MD    [facility]inic [facility] Prenatal Labs  Dating LMP = 9 week [place] Blood type: B/Positive/-- (02/15 1103)   Genetic Screen 1 Screen:    AFP:     Quad:     NIPS: Antibody:Negative (02/15 1103)  Anatomic [place]  Rubella: 15.00 (02/15 1103) Varicella: Non-Immune  GTT Early: 108                28 wk:  RPR: Non Reactive (02/15 1103)   Rhogam  HBsAg: Negative (02/15 1103)   Vaccines TDAP:                       Flu Shot: HIV: Non Reactive (02/15 1103)   Baby Food Breast                               GBS:   Contraception  Pap: 01/04/2020 NIL  CBB     CS/VBAC NA   Support Person Will          Family history of osteogenesis imperfecta 01/04/2020 by Tresea Mall, CNM No       Gestational age appropriate obstetric precautions including but not limited to vaginal bleeding, contractions, leaking of fluid and fetal movement were reviewed in detail with the patient.    Return in about 4 weeks (around 03/04/2020) for ROB.  Vena Austria, MD, [facility], [facility] 02/05/2020, 11:17 AM

## 2020-02-05 NOTE — Progress Notes (Signed)
ROB

## 2020-02-05 NOTE — Addendum Note (Signed)
Addended by: [place], Phi Avans B on: 02/05/2020 11:22 AM   Modules accepted: Orders

## 2020-02-10 LAB — MATERNIT 21 PLUS CORE, BLOOD
Fetal Fraction: 4
Result (T21): NEGATIVE
Trisomy 13 (Patau syndrome): NEGATIVE
Trisomy 18 (Edwards syndrome): NEGATIVE
Trisomy 21 (Down syndrome): NEGATIVE

## 2020-02-20 LAB — INHERITEST CORE(CF97,SMA,FRAX)

## 2020-03-04 ENCOUNTER — Encounter: Payer: Self-pay | Admitting: Advanced Practice Midwife

## 2020-03-13 ENCOUNTER — Ambulatory Visit: Payer: 59 | Admitting: Advanced Practice Midwife

## 2020-03-13 ENCOUNTER — Other Ambulatory Visit: Payer: Self-pay

## 2020-03-13 ENCOUNTER — Encounter: Payer: Self-pay | Admitting: Advanced Practice Midwife

## 2020-03-13 VITALS — BP 140/82 | Wt 208.0 lb

## 2020-03-13 DIAGNOSIS — Z3402 Encounter for supervision of normal first pregnancy, second trimester: Secondary | ICD-10-CM

## 2020-03-13 DIAGNOSIS — M545 Low back pain: Secondary | ICD-10-CM

## 2020-03-13 DIAGNOSIS — Z3A16 16 weeks gestation of pregnancy: Secondary | ICD-10-CM

## 2020-03-13 LAB — POCT URINALYSIS DIPSTICK OB: Glucose, UA: NEGATIVE

## 2020-03-13 NOTE — Progress Notes (Signed)
Routine Prenatal Care Visit  Subjective  Doris Thomas is a 24 y.o. G1P0000 at [redacted]w[redacted]d being seen today for ongoing prenatal care.  She is currently monitored for the following issues for this low-risk pregnancy and has Vitamin D deficiency; Encounter for contraceptive management; Supervision of normal first pregnancy, antepartum; Family history of osteogenesis imperfecta; and Obesity affecting pregnancy, antepartum on their problem list.  ----------------------------------------------------------------------------------- Patient reports mild back pain. We discussed focus on healthy diet and increased physical activity for optimal weight gain. We discussed vitamin d supplementation.   Contractions: Not present. Vag. Bleeding: None.  Movement: Absent. Leaking Fluid denies.  ----------------------------------------------------------------------------------- The following portions of the patient's history were reviewed and updated as appropriate: allergies, current medications, past family history, past medical history, past social history, past surgical history and problem list. Problem list updated.  Objective  Blood pressure 140/82, weight 208 lb (94.3 kg), last menstrual period 11/18/2019. Pregravid weight 185 lb (83.9 kg) Total Weight Gain 23 lb (10.4 kg) Urinalysis: Urine Protein Trace  Urine Glucose Negative  Fetal Status: Fetal Heart Rate (bpm): 152   Movement: Absent     General:  Alert, oriented and cooperative. Patient is in no acute distress.  Skin: Skin is warm and dry. No rash noted.   Cardiovascular: Normal heart rate noted  Respiratory: Normal respiratory effort, no problems with respiration noted  Abdomen: Soft, gravid, appropriate for gestational age. Pain/Pressure: Absent     Pelvic:  Cervical exam deferred        Extremities: Normal range of motion.     Mental Status: Normal mood and affect. Normal behavior. Normal judgment and thought content.   Assessment   24 y.o.  G1P0000 at [redacted]w[redacted]d by  08/24/2020, by Last Menstrual Period presenting for routine prenatal visit  Plan   pregnancy Problems (from 01/04/20 to present)    Problem Noted Resolved   Supervision of normal first pregnancy, antepartum 01/04/2020 by Rod Can, CNM No   Overview Addendum 02/21/2020  1:33 PM by Malachy Mood, MD    [facility] Prenatal Labs  Dating LMP = 9 week [place] Blood type: B/Positive/-- (02/15 1103)   Genetic Screen NIPS: Normal XY Inheritest negative CF, SMA, and Fragile-X Antibody:Negative (02/15 1103)  Anatomic [place]  Rubella: 15.00 (02/15 1103) Varicella: Non-Immune  GTT Early: 108                28 wk:  RPR: Non Reactive (02/15 1103)   Rhogam  HBsAg: Negative (02/15 1103)   Vaccines TDAP:                       Flu Shot: HIV: Non Reactive (02/15 1103)   Baby Food Breast                               GBS:   Contraception  Pap: 01/04/2020 NIL  CBB     CS/VBAC NA   Support Person Will          Family history of osteogenesis imperfecta 01/04/2020 by Rod Can, CNM No       Preterm labor symptoms and general obstetric precautions including but not limited to vaginal bleeding, contractions, leaking of fluid and fetal movement were reviewed in detail with the patient. Back pain: exercise/stretch, heat/ice, epsom salt soaks, abdominal support band Vitamin D deficiency: 1000 units vitamin D3 daily   Return in about 4 weeks (around 04/10/2020) for anatomy scan and  rob.  Tresea Mall, CNM 03/13/2020 2:23 PM

## 2020-03-13 NOTE — Progress Notes (Signed)
Mild back pain. Has question about diet/exercise/weight gain and Vitamin D deficiency. She isn't currently taking Vitamin D as she wasn't certain if the prenatals included Vit D and didn't want to be taking too much.

## 2020-04-10 ENCOUNTER — Ambulatory Visit (INDEPENDENT_AMBULATORY_CARE_PROVIDER_SITE_OTHER): Payer: Medicaid Other

## 2020-04-10 ENCOUNTER — Encounter: Payer: Self-pay | Admitting: Advanced Practice Midwife

## 2020-04-10 ENCOUNTER — Ambulatory Visit (INDEPENDENT_AMBULATORY_CARE_PROVIDER_SITE_OTHER): Payer: Medicaid Other | Admitting: Advanced Practice Midwife

## 2020-04-10 ENCOUNTER — Other Ambulatory Visit: Payer: Self-pay

## 2020-04-10 VITALS — BP 138/80 | Wt 211.0 lb

## 2020-04-10 DIAGNOSIS — Z3A20 20 weeks gestation of pregnancy: Secondary | ICD-10-CM

## 2020-04-10 DIAGNOSIS — Z3402 Encounter for supervision of normal first pregnancy, second trimester: Secondary | ICD-10-CM | POA: Diagnosis not present

## 2020-04-10 LAB — POCT URINALYSIS DIPSTICK OB
Glucose, UA: NEGATIVE
POC,PROTEIN,UA: NEGATIVE

## 2020-04-10 NOTE — Progress Notes (Signed)
Routine Prenatal Care Visit  Subjective  Doris Thomas is a 24 y.o. G1P0000 at [redacted]w[redacted]d being seen today for ongoing prenatal care.  She is currently monitored for the following issues for this low-risk pregnancy and has Vitamin D deficiency; Encounter for contraceptive management; Supervision of normal first pregnancy, antepartum; Family history of osteogenesis imperfecta; and Obesity affecting pregnancy, antepartum on their problem list.  ----------------------------------------------------------------------------------- Patient reports she has some family history of hypertension. She has had some high blood pressure prior to pregnancy which resolved when she increased physical activity. She has never taken blood pressure medication or been diagnosed with hypertension. She admits limited physical activity during this pregnancy. We discussed starting with increasing her exercise and making sure she eats protein with each meal and snack and then address the issue as needed at next visit with BP medication/PIH labs.  Contractions: Not present. Vag. Bleeding: None.  Movement: Present. Leaking Fluid denies.  ----------------------------------------------------------------------------------- The following portions of the patient's history were reviewed and updated as appropriate: allergies, current medications, past family history, past medical history, past social history, past surgical history and problem list. Problem list updated.  Objective  Blood pressure 138/80, weight 211 lb (95.7 kg), last menstrual period 11/18/2019. Pregravid weight 185 lb (83.9 kg) Total Weight Gain 26 lb (11.8 kg) Urinalysis: Urine Protein Negative  Urine Glucose Negative  Fetal Status: Fetal Heart Rate (bpm): 153 Fundal Height: 20 cm Movement: Present      Anatomy Scan: complete, normal, female, breech, anterior placenta  General:  Alert, oriented and cooperative. Patient is in no acute distress.  Skin: Skin is warm and  dry. No rash noted.   Cardiovascular: Normal heart rate noted  Respiratory: Normal respiratory effort, no problems with respiration noted  Abdomen: Soft, gravid, appropriate for gestational age. Pain/Pressure: Absent     Pelvic:  Cervical exam deferred        Extremities: Normal range of motion.  Edema: None  Mental Status: Normal mood and affect. Normal behavior. Normal judgment and thought content.   Assessment   24 y.o. G1P0000 at [redacted]w[redacted]d by  08/24/2020, by Last Menstrual Period presenting for routine prenatal visit  Plan   pregnancy Problems (from 01/04/20 to present)    Problem Noted Resolved   Supervision of normal first pregnancy, antepartum 01/04/2020 by Rod Can, CNM No   Overview Addendum 02/21/2020  1:33 PM by Malachy Mood, MD    [facility] Prenatal Labs  Dating LMP = 9 week [place] Blood type: B/Positive/-- (02/15 1103)   Genetic Screen NIPS: Normal XY Inheritest negative CF, SMA, and Fragile-X Antibody:Negative (02/15 1103)  Anatomic [place] Complete,normal, female Rubella: 15.00 (02/15 1103) Varicella: Non-Immune  GTT Early: 108                28 wk:  RPR: Non Reactive (02/15 1103)   Rhogam  HBsAg: Negative (02/15 1103)   Vaccines TDAP:                       Flu Shot: HIV: Non Reactive (02/15 1103)   Baby Food Breast                               GBS:   Contraception  Pap: 01/04/2020 NIL  CBB     CS/VBAC NA   Support Person Will          Family history of osteogenesis imperfecta 01/04/2020 by Rod Can, CNM  No    BP: increase physical activity, have protein with each meal and snack, labs and consider BP medication at next visit   Preterm labor symptoms and general obstetric precautions including but not limited to vaginal bleeding, contractions, leaking of fluid and fetal movement were reviewed in detail with the patient.   Return in about 4 weeks (around 05/08/2020) for rob.  Tresea Mall, CNM 04/10/2020 3:00 PM

## 2020-04-16 ENCOUNTER — Telehealth: Payer: Self-pay

## 2020-04-16 NOTE — Telephone Encounter (Signed)
PT called regarding a medication, Sodium Chloride Gel, given by her dentist.   Per Jerene Pitch, ok to use

## 2020-05-08 ENCOUNTER — Ambulatory Visit (INDEPENDENT_AMBULATORY_CARE_PROVIDER_SITE_OTHER): Payer: Medicaid Other | Admitting: Advanced Practice Midwife

## 2020-05-08 ENCOUNTER — Other Ambulatory Visit: Payer: Self-pay

## 2020-05-08 ENCOUNTER — Encounter: Payer: Self-pay | Admitting: Advanced Practice Midwife

## 2020-05-08 VITALS — BP 122/74 | Wt 216.0 lb

## 2020-05-08 DIAGNOSIS — Z3A24 24 weeks gestation of pregnancy: Secondary | ICD-10-CM

## 2020-05-08 DIAGNOSIS — Z131 Encounter for screening for diabetes mellitus: Secondary | ICD-10-CM

## 2020-05-08 DIAGNOSIS — Z3402 Encounter for supervision of normal first pregnancy, second trimester: Secondary | ICD-10-CM

## 2020-05-08 DIAGNOSIS — Z113 Encounter for screening for infections with a predominantly sexual mode of transmission: Secondary | ICD-10-CM

## 2020-05-08 DIAGNOSIS — Z130 Encounter for screening for diseases of the blood and blood-forming organs and certain disorders involving the immune mechanism: Secondary | ICD-10-CM

## 2020-05-08 NOTE — Progress Notes (Signed)
Pt has been dizzy at times. No vb. No lof

## 2020-05-08 NOTE — Progress Notes (Signed)
Routine Prenatal Care Visit  Subjective  Doris Thomas is a 24 y.o. G1P0000 at [redacted]w[redacted]d being seen today for ongoing prenatal care.  She is currently monitored for the following issues for this low-risk pregnancy and has Vitamin D deficiency; Encounter for contraceptive management; Supervision of normal first pregnancy, antepartum; Family history of osteogenesis imperfecta; and Obesity affecting pregnancy, antepartum on their problem list.  ----------------------------------------------------------------------------------- Patient reports some episodes of dizziness recently especially when in a store. She notices some spots in her vision and then feels light headed/dizzy and sits down. She feels better when she gets outside. She admits allergies and does take claritin. Her urine is dark yellow.   Contractions: Not present. Vag. Bleeding: None.  Movement: Present. Leaking Fluid denies.  ----------------------------------------------------------------------------------- The following portions of the patient's history were reviewed and updated as appropriate: allergies, current medications, past family history, past medical history, past social history, past surgical history and problem list. Problem list updated.  Objective  Blood pressure 122/74, weight 216 lb (98 kg), last menstrual period 11/18/2019. Pregravid weight 190 lb (86.2 kg) Total Weight Gain 26 lb (11.8 kg) Urinalysis: Urine Protein    Urine Glucose    Fetal Status: Fetal Heart Rate (bpm): 156 Fundal Height: 24 cm Movement: Present     General:  Alert, oriented and cooperative. Patient is in no acute distress.  Skin: Skin is warm and dry. No rash noted.   Cardiovascular: Normal heart rate noted  Respiratory: Normal respiratory effort, no problems with respiration noted  Abdomen: Soft, gravid, appropriate for gestational age. Pain/Pressure: Absent     Pelvic:  Cervical exam deferred        Extremities: Normal range of motion.  Edema:  None  Mental Status: Normal mood and affect. Normal behavior. Normal judgment and thought content.   Assessment   24 y.o. G1P0000 at [redacted]w[redacted]d by  08/24/2020, by Last Menstrual Period presenting for routine prenatal visit  Plan   pregnancy Problems (from 01/04/20 to present)    Problem Noted Resolved   Supervision of normal first pregnancy, antepartum 01/04/2020 by Tresea Mall, CNM No   Overview Addendum 04/10/2020  3:08 PM by Tresea Mall, CNM    [facility] Prenatal Labs  Dating LMP = 9 week [place] Blood type: B/Positive/-- (02/15 1103)   Genetic Screen NIPS: Normal XY Inheritest negative CF, SMA, and Fragile-X Antibody:Negative (02/15 1103)  Anatomic [place] Complete, normal, female Rubella: 15.00 (02/15 1103) Varicella: Non-Immune  GTT Early: 108                28 wk:  RPR: Non Reactive (02/15 1103)   Rhogam  HBsAg: Negative (02/15 1103)   Vaccines TDAP:                       Flu Shot: HIV: Non Reactive (02/15 1103)   Baby Food Breast                               GBS:   Contraception  Pap: 01/04/2020 NIL  CBB     CS/VBAC NA   Support Person Will          Family history of osteogenesis imperfecta 01/04/2020 by Tresea Mall, CNM No    Dizziness: increase hydration, eat adequate protein with each meal/snack, take allergy medication   Preterm labor symptoms and general obstetric precautions including but not limited to vaginal bleeding, contractions, leaking of fluid and fetal  movement were reviewed in detail with the patient.   Return in about 4 weeks (around 06/05/2020) for 28 wk labs and rob.  Rod Can, CNM 05/08/2020 11:04 AM

## 2020-05-09 ENCOUNTER — Telehealth: Payer: Self-pay | Admitting: *Deleted

## 2020-05-09 NOTE — Telephone Encounter (Signed)
Patient called she is currently 6 months pregnant and would like to know if it is ok for her to take children's benadryl for allergies. She said she [place] unable to contact her OBGYN, please call her at [phone].

## 2020-05-09 NOTE — Telephone Encounter (Signed)
OK to take adult dose of antihistamines for her allergies if needed

## 2020-05-09 NOTE — Telephone Encounter (Signed)
Per agent: "Patient called she is currently 6 months pregnant and would like to know if it is ok for her to take children's benadryl for allergies. She said she [place] unable to contact her OBGYN, please call her at [phone]." Please advise

## 2020-05-09 NOTE — Telephone Encounter (Signed)
Patient notified

## 2020-06-05 ENCOUNTER — Ambulatory Visit (INDEPENDENT_AMBULATORY_CARE_PROVIDER_SITE_OTHER): Payer: Medicaid Other | Admitting: Obstetrics and Gynecology

## 2020-06-05 ENCOUNTER — Encounter: Payer: Self-pay | Admitting: Obstetrics and Gynecology

## 2020-06-05 ENCOUNTER — Other Ambulatory Visit: Payer: Self-pay

## 2020-06-05 ENCOUNTER — Other Ambulatory Visit: Payer: Medicaid Other

## 2020-06-05 VITALS — BP 120/74 | Wt 221.0 lb

## 2020-06-05 DIAGNOSIS — O0990 Supervision of high risk pregnancy, unspecified, unspecified trimester: Secondary | ICD-10-CM

## 2020-06-05 DIAGNOSIS — Z3A28 28 weeks gestation of pregnancy: Secondary | ICD-10-CM

## 2020-06-05 DIAGNOSIS — Z130 Encounter for screening for diseases of the blood and blood-forming organs and certain disorders involving the immune mechanism: Secondary | ICD-10-CM

## 2020-06-05 DIAGNOSIS — O99210 Obesity complicating pregnancy, unspecified trimester: Secondary | ICD-10-CM

## 2020-06-05 DIAGNOSIS — Z131 Encounter for screening for diabetes mellitus: Secondary | ICD-10-CM

## 2020-06-05 DIAGNOSIS — Z113 Encounter for screening for infections with a predominantly sexual mode of transmission: Secondary | ICD-10-CM

## 2020-06-05 DIAGNOSIS — Z8269 Family history of other diseases of the musculoskeletal system and connective tissue: Secondary | ICD-10-CM

## 2020-06-05 DIAGNOSIS — Z8279 Family history of other congenital malformations, deformations and chromosomal abnormalities: Secondary | ICD-10-CM

## 2020-06-05 DIAGNOSIS — Z3402 Encounter for supervision of normal first pregnancy, second trimester: Secondary | ICD-10-CM

## 2020-06-05 NOTE — Progress Notes (Signed)
  Routine Prenatal Care Visit  Subjective  Doris Thomas is a 24 y.o. G1P0000 at [redacted]w[redacted]d being seen today for ongoing prenatal care.  She is currently monitored for the following issues for this high-risk pregnancy and has Vitamin D deficiency; Encounter for contraceptive management; Supervision of high risk pregnancy, antepartum; Family history of osteogenesis imperfecta; and Obesity affecting pregnancy, antepartum on their problem list.  ----------------------------------------------------------------------------------- Patient reports no complaints.   Contractions: Not present. Vag. Bleeding: None.  Movement: Present. Leaking Fluid denies.  ----------------------------------------------------------------------------------- The following portions of the patient's history were reviewed and updated as appropriate: allergies, current medications, past family history, past medical history, past social history, past surgical history and problem list. Problem list updated.  Objective  Blood pressure 120/74, weight 221 lb (100.2 kg), last menstrual period 11/18/2019. Pregravid weight 190 lb (86.2 kg) Total Weight Gain 31 lb (14.1 kg) Urinalysis: Urine Protein    Urine Glucose    Fetal Status: Fetal Heart Rate (bpm): 140 Fundal Height: 28 cm Movement: Present     General:  Alert, oriented and cooperative. Patient is in no acute distress.  Skin: Skin is warm and dry. No rash noted.   Cardiovascular: Normal heart rate noted  Respiratory: Normal respiratory effort, no problems with respiration noted  Abdomen: Soft, gravid, appropriate for gestational age. Pain/Pressure: Absent     Pelvic:  Cervical exam deferred        Extremities: Normal range of motion.  Edema: None  Mental Status: Normal mood and affect. Normal behavior. Normal judgment and thought content.   Assessment   24 y.o. G1P0000 at [redacted]w[redacted]d by  08/24/2020, by Last Menstrual Period presenting for routine prenatal visit  Plan   pregnancy  Problems (from 01/04/20 to present)    Problem Noted Resolved   Supervision of high risk pregnancy, antepartum 01/04/2020 by Tresea Mall, CNM No   Overview Addendum 04/10/2020  3:08 PM by Tresea Mall, CNM    [facility] Prenatal Labs  Dating LMP = 9 week [place] Blood type: B/Positive/-- (02/15 1103)   Genetic Screen NIPS: Normal XY Inheritest negative CF, SMA, and Fragile-X Antibody:Negative (02/15 1103)  Anatomic [place] Complete, normal, female Rubella: 15.00 (02/15 1103) Varicella: Non-Immune  GTT Early: 108                28 wk:  RPR: Non Reactive (02/15 1103)   Rhogam  HBsAg: Negative (02/15 1103)   Vaccines TDAP:                       Flu Shot: HIV: Non Reactive (02/15 1103)   Baby Food Breast                               GBS:   Contraception  Pap: 01/04/2020 NIL  CBB     CS/VBAC NA   Support Person Will          Previous Version   Family history of osteogenesis imperfecta 01/04/2020 by Tresea Mall, CNM No       Preterm labor symptoms and general obstetric precautions including but not limited to vaginal bleeding, contractions, leaking of fluid and fetal movement were reviewed in detail with the patient. Please refer to After Visit Summary for other counseling recommendations.   Return in about 2 weeks (around 06/19/2020) for Routine Prenatal Appointment.  Thomasene Mohair, MD, [facility], [facility] 06/05/2020 10:23 AM

## 2020-06-06 LAB — 28 WEEK RH+PANEL
Basophils Absolute: 0.1 10*3/uL (ref 0.0–0.2)
Basos: 1 %
EOS (ABSOLUTE): 0.1 10*3/uL (ref 0.0–0.4)
Eos: 1 %
Gestational Diabetes Screen: 95 mg/dL (ref 65–139)
HIV Screen 4th Generation wRfx: NONREACTIVE
Hematocrit: 35.6 % (ref 34.0–46.6)
Hemoglobin: 11.6 g/dL (ref 11.1–15.9)
Immature Grans (Abs): 0.1 10*3/uL (ref 0.0–0.1)
Immature Granulocytes: 1 %
Lymphocytes Absolute: 2 10*3/uL (ref 0.7–3.1)
Lymphs: 18 %
MCH: 28.2 pg (ref 26.6–33.0)
MCHC: 32.6 g/dL (ref 31.5–35.7)
MCV: 87 fL (ref 79–97)
Monocytes Absolute: 1.1 10*3/uL — ABNORMAL HIGH (ref 0.1–0.9)
Monocytes: 10 %
Neutrophils Absolute: 7.7 10*3/uL — ABNORMAL HIGH (ref 1.4–7.0)
Neutrophils: 69 %
Platelets: 280 10*3/uL (ref 150–450)
RBC: 4.11 x10E6/uL (ref 3.77–5.28)
RDW: 14 % (ref 11.7–15.4)
RPR Ser Ql: NONREACTIVE
WBC: 11.1 10*3/uL — ABNORMAL HIGH (ref 3.4–10.8)

## 2020-06-19 ENCOUNTER — Other Ambulatory Visit: Payer: Self-pay

## 2020-06-19 ENCOUNTER — Ambulatory Visit (INDEPENDENT_AMBULATORY_CARE_PROVIDER_SITE_OTHER): Payer: Medicaid Other | Admitting: Certified Nurse Midwife

## 2020-06-19 VITALS — BP 136/70 | Ht 65.0 in | Wt 221.6 lb

## 2020-06-19 DIAGNOSIS — O0990 Supervision of high risk pregnancy, unspecified, unspecified trimester: Secondary | ICD-10-CM

## 2020-06-19 DIAGNOSIS — O0993 Supervision of high risk pregnancy, unspecified, third trimester: Secondary | ICD-10-CM

## 2020-06-19 DIAGNOSIS — Z3A30 30 weeks gestation of pregnancy: Secondary | ICD-10-CM

## 2020-06-19 DIAGNOSIS — O99210 Obesity complicating pregnancy, unspecified trimester: Secondary | ICD-10-CM

## 2020-06-19 DIAGNOSIS — O99213 Obesity complicating pregnancy, third trimester: Secondary | ICD-10-CM

## 2020-06-19 DIAGNOSIS — Z6836 Body mass index (BMI) 36.0-36.9, adult: Secondary | ICD-10-CM

## 2020-06-19 LAB — POCT URINALYSIS DIPSTICK OB
Glucose, UA: NEGATIVE
POC,PROTEIN,UA: NEGATIVE

## 2020-06-23 ENCOUNTER — Encounter: Payer: Self-pay | Admitting: Certified Nurse Midwife

## 2020-06-23 NOTE — Progress Notes (Signed)
ROB at 30wk4d: Doing well. Baby active. Desires to breast feed. No regular contractions, vaginal bleeding or leakage of water  Exam: BP 136/70. Negative proteinuria. FH 31 cm FHTs: 161 (baby very active), Wt 221# 9oz (TWG 31#). BMI 36.88 kg/m2  A: IUP at 30wk4d S=D BMI 36.88  P: Growth scan and ROB in 2 weeks Discussed virtual childbirth classes TDAP next visit Breast feeding education:  The following were addressed during this visit:  Breastfeeding Education - Rooming-in on a 24-hour basis    Comments: Easier to learn baby's feeding cues, easier to bond and get to know each other, and encourages milk production.   - Feeding on demand or baby-led feeding    Comments: Helps prevent breastfeeding complications, helps bring in good milk supply, prevents under or overfeeding, and helps baby feel content and satisfied   - Frequent feeding to help assure optimal milk production    Comments: Making a full supply of milk requires frequent removal of milk from breasts, infant will eat 8-12 times in 24 hours, if separated from infant use breast massage, hand expression and/ or pumping to remove milk from breasts.  Farrel Conners, CNM

## 2020-07-04 ENCOUNTER — Encounter: Payer: Self-pay | Admitting: Certified Nurse Midwife

## 2020-07-04 ENCOUNTER — Ambulatory Visit (INDEPENDENT_AMBULATORY_CARE_PROVIDER_SITE_OTHER): Payer: Medicaid Other

## 2020-07-04 ENCOUNTER — Other Ambulatory Visit: Payer: Self-pay

## 2020-07-04 ENCOUNTER — Ambulatory Visit (INDEPENDENT_AMBULATORY_CARE_PROVIDER_SITE_OTHER): Payer: Medicaid Other | Admitting: Certified Nurse Midwife

## 2020-07-04 VITALS — BP 110/70 | Wt 225.0 lb

## 2020-07-04 DIAGNOSIS — O0990 Supervision of high risk pregnancy, unspecified, unspecified trimester: Secondary | ICD-10-CM

## 2020-07-04 DIAGNOSIS — Z3A32 32 weeks gestation of pregnancy: Secondary | ICD-10-CM

## 2020-07-04 DIAGNOSIS — O99213 Obesity complicating pregnancy, third trimester: Secondary | ICD-10-CM

## 2020-07-04 DIAGNOSIS — Z23 Encounter for immunization: Secondary | ICD-10-CM

## 2020-07-04 DIAGNOSIS — Z6836 Body mass index (BMI) 36.0-36.9, adult: Secondary | ICD-10-CM

## 2020-07-04 DIAGNOSIS — R0989 Other specified symptoms and signs involving the circulatory and respiratory systems: Secondary | ICD-10-CM

## 2020-07-04 DIAGNOSIS — O99210 Obesity complicating pregnancy, unspecified trimester: Secondary | ICD-10-CM

## 2020-07-04 DIAGNOSIS — O0993 Supervision of high risk pregnancy, unspecified, third trimester: Secondary | ICD-10-CM

## 2020-07-04 LAB — POCT URINALYSIS DIPSTICK OB: Glucose, UA: NEGATIVE

## 2020-07-04 NOTE — Progress Notes (Signed)
C/o monitoring BP at home - off and on high; dizziness; H/As; fatigue; swelling; all off and on.  Feels better after lying down.  Family hx of preeclampsia.

## 2020-07-04 NOTE — Progress Notes (Signed)
ROB at 32wk5d: Since last visit has experienced some lightheadedness, headache behind her eyes, fatigue and swelling in her lower extremities. Blood pressures at home have been borderline elevated at times but have been <140/90. Baby active.  Review of blood pressures130/80 at 9wk, 140/82 at 16wks, 138/80 at 20 weeks. Today BP 110/70 trace proteinuria. FH 32cm. Weight 225 (TWG 35#), BMI 37.4.. FHT 141 No edema in lower extremities.   Ultrasound today: CGA 32wk5d, cephalic, 4#4oz ( 37.6%), AFI 54.4BE 28 week labs WNL  A: IUP at 32wk5d S=D ?Chronic hypertension  P: Baseline CMP and PC ratio today RTO 1 week for blood pressure check. To bring blood pressure cuff with her to see if correct size TDAP today Signed blood transfusion consent Farrel Conners, CNM

## 2020-07-05 LAB — CMP14+EGFR
ALT: 19 IU/L (ref 0–32)
AST: 15 IU/L (ref 0–40)
Albumin/Globulin Ratio: 0.9 — ABNORMAL LOW (ref 1.2–2.2)
Albumin: 3.5 g/dL — ABNORMAL LOW (ref 3.9–5.0)
Alkaline Phosphatase: 140 IU/L — ABNORMAL HIGH (ref 48–121)
BUN/Creatinine Ratio: 10 (ref 9–23)
BUN: 6 mg/dL (ref 6–20)
Bilirubin Total: 0.2 mg/dL (ref 0.0–1.2)
CO2: 20 mmol/L (ref 20–29)
Calcium: 9 mg/dL (ref 8.7–10.2)
Chloride: 101 mmol/L (ref 96–106)
Creatinine, Ser: 0.58 mg/dL (ref 0.57–1.00)
GFR calc Af Amer: 148 mL/min/{1.73_m2} (ref >59)
GFR calc non Af Amer: 129 mL/min/{1.73_m2} (ref >59)
Globulin, Total: 4 g/dL (ref 1.5–4.5)
Glucose: 78 mg/dL (ref 65–99)
Potassium: 4.1 mmol/L (ref 3.5–5.2)
Sodium: 133 mmol/L — ABNORMAL LOW (ref 134–144)
Total Protein: 7.5 g/dL (ref 6.0–8.5)

## 2020-07-05 LAB — PROTEIN / CREATININE RATIO, URINE
Creatinine, Urine: 258.5 mg/dL
Protein, Ur: 27.1 mg/dL
Protein/Creat Ratio: 105 mg/g creat (ref 0–200)

## 2020-07-05 NOTE — Addendum Note (Signed)
Addended by: Loran Senters D on: 07/05/2020 08:55 AM   Modules accepted: Orders

## 2020-07-10 ENCOUNTER — Other Ambulatory Visit: Payer: Self-pay

## 2020-07-10 ENCOUNTER — Encounter: Payer: Self-pay | Admitting: Obstetrics & Gynecology

## 2020-07-10 ENCOUNTER — Ambulatory Visit (INDEPENDENT_AMBULATORY_CARE_PROVIDER_SITE_OTHER): Payer: Medicaid Other | Admitting: Obstetrics & Gynecology

## 2020-07-10 VITALS — BP 130/80 | Wt 226.0 lb

## 2020-07-10 DIAGNOSIS — Z3A33 33 weeks gestation of pregnancy: Secondary | ICD-10-CM

## 2020-07-10 DIAGNOSIS — O0993 Supervision of high risk pregnancy, unspecified, third trimester: Secondary | ICD-10-CM

## 2020-07-10 NOTE — Patient Instructions (Signed)
Third Trimester of Pregnancy The third trimester is from week 28 through week 40 (months 7 through 9). The third trimester is a time when the unborn baby (fetus) is growing rapidly. At the end of the ninth month, the fetus is about 20 inches in length and weighs 6-10 pounds. Body changes during your third trimester Your body will continue to go through many changes during pregnancy. The changes vary from woman to woman. During the third trimester:  Your weight will continue to increase. You can expect to gain 25-35 pounds (11-16 kg) by the end of the pregnancy.  You may begin to get stretch marks on your hips, abdomen, and breasts.  You may urinate more often because the fetus is moving lower into your pelvis and pressing on your bladder.  You may develop or continue to have heartburn. This is caused by increased hormones that slow down muscles in the digestive tract.  You may develop or continue to have constipation because increased hormones slow digestion and cause the muscles that push waste through your intestines to relax.  You may develop hemorrhoids. These are swollen veins (varicose veins) in the rectum that can itch or be painful.  You may develop swollen, bulging veins (varicose veins) in your legs.  You may have increased body aches in the pelvis, back, or thighs. This is due to weight gain and increased hormones that are relaxing your joints.  You may have changes in your hair. These can include thickening of your hair, rapid growth, and changes in texture. Some women also have hair loss during or after pregnancy, or hair that feels dry or thin. Your hair will most likely return to normal after your baby is born.  Your breasts will continue to grow and they will continue to become tender. A yellow fluid (colostrum) may leak from your breasts. This is the first milk you are producing for your baby.  Your belly button may stick out.  You may notice more swelling in your hands,  face, or ankles.  You may have increased tingling or numbness in your hands, arms, and legs. The skin on your belly may also feel numb.  You may feel short of breath because of your expanding uterus.  You may have more problems sleeping. This can be caused by the size of your belly, increased need to urinate, and an increase in your body's metabolism.  You may notice the fetus "dropping," or moving lower in your abdomen (lightening).  You may have increased vaginal discharge.  You may notice your joints feel loose and you may have pain around your pelvic bone. What to expect at prenatal visits You will have prenatal exams every 2 weeks until week 36. Then you will have weekly prenatal exams. During a routine prenatal visit:  You will be weighed to make sure you and the baby are growing normally.  Your blood pressure will be taken.  Your abdomen will be measured to track your baby's growth.  The fetal heartbeat will be listened to.  Any test results from the previous visit will be discussed.  You may have a cervical check near your due date to see if your cervix has softened or thinned (effaced).  You will be tested for Group B streptococcus. This happens between 35 and 37 weeks. Your health care provider may ask you:  What your birth plan is.  How you are feeling.  If you are feeling the baby move.  If you have had any abnormal   symptoms, such as leaking fluid, bleeding, severe headaches, or abdominal cramping.  If you are using any tobacco products, including cigarettes, chewing tobacco, and electronic cigarettes.  If you have any questions. Other tests or screenings that may be performed during your third trimester include:  Blood tests that check for low iron levels (anemia).  Fetal testing to check the health, activity level, and growth of the fetus. Testing is done if you have certain medical conditions or if there are problems during the pregnancy.  Nonstress test  (NST). This test checks the health of your baby to make sure there are no signs of problems, such as the baby not getting enough oxygen. During this test, a belt is placed around your belly. The baby is made to move, and its heart rate is monitored during movement. What is false labor? False labor is a condition in which you feel small, irregular tightenings of the muscles in the womb (contractions) that usually go away with rest, changing position, or drinking water. These are called Braxton Hicks contractions. Contractions may last for hours, days, or even weeks before true labor sets in. If contractions come at regular intervals, become more frequent, increase in intensity, or become painful, you should see your health care provider. What are the signs of labor?  Abdominal cramps.  Regular contractions that start at 10 minutes apart and become stronger and more frequent with time.  Contractions that start on the top of the uterus and spread down to the lower abdomen and back.  Increased pelvic pressure and dull back pain.  A watery or bloody mucus discharge that comes from the vagina.  Leaking of amniotic fluid. This is also known as your "water breaking." It could be a slow trickle or a gush. Let your health care provider know if it has a color or strange odor. If you have any of these signs, call your health care provider right away, even if it is before your due date. Follow these instructions at home: Medicines  Follow your health care provider's instructions regarding medicine use. Specific medicines may be either safe or unsafe to take during pregnancy.  Take a prenatal vitamin that contains at least 600 micrograms (mcg) of folic acid.  If you develop constipation, try taking a stool softener if your health care provider approves. Eating and drinking   Eat a balanced diet that includes fresh fruits and vegetables, whole grains, good sources of protein such as meat, eggs, or tofu,  and low-fat dairy. Your health care provider will help you determine the amount of weight gain that is right for you.  Avoid raw meat and uncooked cheese. These carry germs that can cause birth defects in the baby.  If you have low calcium intake from food, talk to your health care provider about whether you should take a daily calcium supplement.  Eat four or five small meals rather than three large meals a day.  Limit foods that are high in fat and processed sugars, such as fried and sweet foods.  To prevent constipation: ? Drink enough fluid to keep your urine clear or pale yellow. ? Eat foods that are high in fiber, such as fresh fruits and vegetables, whole grains, and beans. Activity  Exercise only as directed by your health care provider. Most women can continue their usual exercise routine during pregnancy. Try to exercise for 30 minutes at least 5 days a week. Stop exercising if you experience uterine contractions.  Avoid heavy lifting.  Do   not exercise in extreme heat or humidity, or at high altitudes.  Wear low-heel, comfortable shoes.  Practice good posture.  You may continue to have sex unless your health care provider tells you otherwise. Relieving pain and discomfort  Take frequent breaks and rest with your legs elevated if you have leg cramps or low back pain.  Take warm sitz baths to soothe any pain or discomfort caused by hemorrhoids. Use hemorrhoid cream if your health care provider approves.  Wear a good support bra to prevent discomfort from breast tenderness.  If you develop varicose veins: ? Wear support pantyhose or compression stockings as told by your healthcare provider. ? Elevate your feet for 15 minutes, 3-4 times a day. Prenatal care  Write down your questions. Take them to your prenatal visits.  Keep all your prenatal visits as told by your health care provider. This is important. Safety  Wear your seat belt at all times when driving.  Make  a list of emergency phone numbers, including numbers for family, friends, the hospital, and police and fire departments. General instructions  Avoid cat litter boxes and soil used by cats. These carry germs that can cause birth defects in the baby. If you have a cat, ask someone to clean the litter box for you.  Do not travel far distances unless it is absolutely necessary and only with the approval of your health care provider.  Do not use hot tubs, steam rooms, or saunas.  Do not drink alcohol.  Do not use any products that contain nicotine or tobacco, such as cigarettes and e-cigarettes. If you need help quitting, ask your health care provider.  Do not use any medicinal herbs or unprescribed drugs. These chemicals affect the formation and growth of the baby.  Do not douche or use tampons or scented sanitary pads.  Do not cross your legs for long periods of time.  To prepare for the arrival of your baby: ? Take prenatal classes to understand, practice, and ask questions about labor and delivery. ? Make a trial run to the hospital. ? Visit the hospital and tour the maternity area. ? Arrange for maternity or paternity leave through employers. ? Arrange for family and friends to take care of pets while you are in the hospital. ? Purchase a rear-facing car seat and make sure you know how to install it in your car. ? Pack your hospital bag. ? Prepare the baby's nursery. Make sure to remove all pillows and stuffed animals from the baby's crib to prevent suffocation.  Visit your dentist if you have not gone during your pregnancy. Use a soft toothbrush to brush your teeth and be gentle when you floss. Contact a health care provider if:  You are unsure if you are in labor or if your water has broken.  You become dizzy.  You have mild pelvic cramps, pelvic pressure, or nagging pain in your abdominal area.  You have lower back pain.  You have persistent nausea, vomiting, or  diarrhea.  You have an unusual or bad smelling vaginal discharge.  You have pain when you urinate. Get help right away if:  Your water breaks before 37 weeks.  You have regular contractions less than 5 minutes apart before 37 weeks.  You have a fever.  You are leaking fluid from your vagina.  You have spotting or bleeding from your vagina.  You have severe abdominal pain or cramping.  You have rapid weight loss or weight gain.  You have   shortness of breath with chest pain.  You notice sudden or extreme swelling of your face, hands, ankles, feet, or legs.  Your baby makes fewer than 10 movements in 2 hours.  You have severe headaches that do not go away when you take medicine.  You have vision changes. Summary  The third trimester is from week 28 through week 40, months 7 through 9. The third trimester is a time when the unborn baby (fetus) is growing rapidly.  During the third trimester, your discomfort may increase as you and your baby continue to gain weight. You may have abdominal, leg, and back pain, sleeping problems, and an increased need to urinate.  During the third trimester your breasts will keep growing and they will continue to become tender. A yellow fluid (colostrum) may leak from your breasts. This is the first milk you are producing for your baby.  False labor is a condition in which you feel small, irregular tightenings of the muscles in the womb (contractions) that eventually go away. These are called Braxton Hicks contractions. Contractions may last for hours, days, or even weeks before true labor sets in.  Signs of labor can include: abdominal cramps; regular contractions that start at 10 minutes apart and become stronger and more frequent with time; watery or bloody mucus discharge that comes from the vagina; increased pelvic pressure and dull back pain; and leaking of amniotic fluid. This information is not intended to replace advice given to you by your  health care provider. Make sure you discuss any questions you have with your health care provider. Document Revised: 03/16/2019 Document Reviewed: 12/29/2016 Elsevier Patient Education  2020 [employer]  

## 2020-07-10 NOTE — Progress Notes (Signed)
  Subjective  Fetal Movement? yes Contractions? no Leaking Fluid? no Vaginal Bleeding? no Mild edema.  Reflux daily.  Some nausea. Objective  BP 130/80   Wt 226 lb (102.5 kg)   LMP 11/18/2019 (Exact Date)   BMI 37.61 kg/m  General: NAD Pumonary: no increased work of breathing Abdomen: gravid, non-tender Extremities: no edema Psychiatric: mood appropriate, affect full  Assessment  24 y.o. G1P0000 at [redacted]w[redacted]d by  08/24/2020, by Last Menstrual Period presenting for routine prenatal visit  Plan   Problem List Items Addressed This Visit    None    Visit Diagnoses    [redacted] weeks gestation of pregnancy    -  Primary   Supervision of high risk pregnancy in third trimester          pregnancy Problems (from 01/04/20 to present)    Problem Noted Resolved   Supervision of high risk pregnancy, antepartum 01/04/2020 by Tresea Mall, CNM No   Overview Addendum 07/04/2020  9:57 PM by Farrel Conners, CNM    Clinic [facility] Prenatal Labs  Dating LMP = 9 week [place] Blood type: B/Positive/-- (02/15 1103)   Genetic Screen NIPS: Normal XY Inheritest negative CF, SMA, and Fragile-X Antibody:Negative (02/15 1103)  Anatomic [place] Complete, normal, female Rubella: 15.00 (02/15 1103) Varicella: Non-Immune  GTT Early: 108                28 wk: 95 RPR: Non Reactive (02/15 1103)   Rhogam  HBsAg: Negative (02/15 1103)   Vaccines TDAP:  07/04/2020                     Flu Shot: HIV: Non Reactive (02/15 1103)   Baby Food Breast                               GBS:   Contraception  Pap: 01/04/2020 NIL  CBB     CS/VBAC NA   Support Person Will          Previous Version   Family history of osteogenesis imperfecta 01/04/2020 by Tresea Mall, CNM No     PNV, FMC, Labor precautions, Reflux- tx w Nexium  Uncertain contraception, counseled on options  Annamarie Major, MD, [facility], [facility] 07/10/2020  4:02 PM

## 2020-07-24 ENCOUNTER — Other Ambulatory Visit (HOSPITAL_COMMUNITY)
Admission: RE | Admit: 2020-07-24 | Discharge: 2020-07-24 | Disposition: A | Discharge status: Home / Self Care | Payer: Medicaid Other | Source: Ambulatory Visit | Attending: Obstetrics and Gynecology | Admitting: Obstetrics and Gynecology

## 2020-07-24 ENCOUNTER — Other Ambulatory Visit: Payer: Self-pay

## 2020-07-24 ENCOUNTER — Ambulatory Visit (INDEPENDENT_AMBULATORY_CARE_PROVIDER_SITE_OTHER): Payer: Medicaid Other | Admitting: Obstetrics and Gynecology

## 2020-07-24 ENCOUNTER — Encounter: Payer: Self-pay | Admitting: Obstetrics and Gynecology

## 2020-07-24 VITALS — BP 128/70 | Ht 65.0 in | Wt 232.4 lb

## 2020-07-24 DIAGNOSIS — Z3A35 35 weeks gestation of pregnancy: Secondary | ICD-10-CM

## 2020-07-24 DIAGNOSIS — O0993 Supervision of high risk pregnancy, unspecified, third trimester: Secondary | ICD-10-CM | POA: Insufficient documentation

## 2020-07-24 DIAGNOSIS — O99210 Obesity complicating pregnancy, unspecified trimester: Secondary | ICD-10-CM

## 2020-07-24 DIAGNOSIS — L732 Hidradenitis suppurativa: Secondary | ICD-10-CM

## 2020-07-24 DIAGNOSIS — O99213 Obesity complicating pregnancy, third trimester: Secondary | ICD-10-CM

## 2020-07-24 LAB — OB RESULTS CONSOLE GC/CHLAMYDIA: Gonorrhea: NEGATIVE

## 2020-07-24 MED ORDER — CLINDAMYCIN PHOSPHATE 1 % EX SOLN: Freq: Two times a day (BID) | CUTANEOUS | 0 refills | Status: DC

## 2020-07-24 NOTE — Progress Notes (Signed)
Routine Prenatal Care Visit  Subjective  Doris Thomas is a 24 y.o. G1P0000 at [redacted]w[redacted]d being seen today for ongoing prenatal care.  She is currently monitored for the following issues for this high-risk pregnancy and has Vitamin D deficiency; Encounter for contraceptive management; Supervision of high risk pregnancy, antepartum; Family history of osteogenesis imperfecta; and Obesity affecting pregnancy, antepartum on their problem list.  ----------------------------------------------------------------------------------- Patient reports no complaints.   Contractions: Not present. Vag. Bleeding: None.  Movement: Present. Denies leaking of fluid.  ----------------------------------------------------------------------------------- The following portions of the patient's history were reviewed and updated as appropriate: allergies, current medications, past family history, past medical history, past social history, past surgical history and problem list. Problem list updated.   Objective  Blood pressure 128/70, height 5\' 5"  (1.651 m), weight 232 lb 6.4 oz (105.4 kg), last menstrual period 11/18/2019. Pregravid weight 190 lb (86.2 kg) Total Weight Gain 42 lb 6.4 oz (19.2 kg) Urinalysis:      Fetal Status: Fetal Heart Rate (bpm): 145 Fundal Height: 37 cm Movement: Present     General:  Alert, oriented and cooperative. Patient is in no acute distress.  Skin: Skin is warm and dry. No rash noted.   Cardiovascular: Normal heart rate noted  Respiratory: Normal respiratory effort, no problems with respiration noted  Abdomen: Soft, gravid, appropriate for gestational age. Pain/Pressure: Absent     Pelvic:  Cervical exam deferred        Extremities: Normal range of motion.     Mental Status: Normal mood and affect. Normal behavior. Normal judgment and thought content.     Assessment   23 y.o. G1P0000 at [redacted]w[redacted]d by  08/24/2020, by Last Menstrual Period presenting for routine prenatal visit  Plan    pregnancy Problems (from 01/04/20 to present)    Problem Noted Resolved   Supervision of high risk pregnancy, antepartum 01/04/2020 by 01/06/2020, CNM No   Overview Addendum 07/04/2020  9:57 PM by 07/06/2020, CNM    Clinic [facility] Prenatal Labs  Dating LMP = 9 week Farrel Conners Blood type: B/Positive/-- (02/15 1103)   Genetic Screen NIPS: Normal XY Inheritest negative CF, SMA, and Fragile-X Antibody:Negative (02/15 1103)  Anatomic [birth year]2001 Complete, normal, female Rubella: 15.00 (02/15 1103) Varicella: Non-Immune  GTT Early: 108                28 wk: 95 RPR: Non Reactive (02/15 1103)   Rhogam  HBsAg: Negative (02/15 1103)   Vaccines TDAP:  07/04/2020                     Flu Shot: HIV: Non Reactive (02/15 1103)   Baby Food Breast                               GBS:   Contraception  Pap: 01/04/2020 NIL  CBB     CS/VBAC NA   Support Person Will          Previous Version   Family history of osteogenesis imperfecta 01/04/2020 by 01/06/2020, CNM No      GBS/CT/ GC today Groin Hidradenitis - topical clindamycin and hibiclens advised. Advised to stop shaving Discussed Covid vaccination  Gestational age appropriate obstetric precautions including but not limited to vaginal bleeding, contractions, leaking of fluid and fetal movement were reviewed in detail with the patient.    Return in about 1 week (around 07/31/2020) for ROB in person/ 08/02/2020  AFI/ NST.  Natale Milch MD [facility], [facility] 07/24/2020, 3:58 PM

## 2020-07-24 NOTE — Patient Instructions (Addendum)
Pain Relief During Labor and Delivery Many things can cause pain during labor and delivery, including:  Pressure on bones and ligaments due to the baby moving through the pelvis.  Stretching of tissues due to the baby moving through the birth canal.  Muscle tension due to anxiety or nervousness.  The uterus tightening (contracting) and relaxing to help move the baby. There are many ways to deal with the pain of labor and delivery. They include:  Taking prenatal classes. Taking these classes helps you know what to expect during your baby's birth. What you learn will increase your confidence and decrease your anxiety.  Practicing relaxation techniques or doing relaxing activities, such as: ? Focused breathing. ? Meditation. ? Visualization. ? Aroma therapy. ? Listening to your favorite music. ? Hypnosis.  Taking a warm shower or bath (hydrotherapy). This may: ? Provide comfort and relaxation. ? Lessen your perception of pain. ? Decrease the amount of pain medicine needed. ? Decrease the length of labor.  Getting a massage or counterpressure on your back.  Applying warm packs or ice packs.  Changing positions often, moving around, or using a birthing ball.  Getting: ? Pain medicine through an IV or injection into a muscle. ? Pain medicine inserted into your spinal column. ? Injections of sterile water just under the skin on your lower back (intradermal injections). ? Laughing gas (nitrous oxide). Discuss your pain control options with your health care provider during your prenatal visits. Explore the options offered by your hospital or birth center. What kinds of medicine are available? There are two kinds of medicines that can be used to relieve pain during labor and delivery:  Analgesics. These medicines decrease pain without causing you to lose feeling or the ability to move your muscles.  Anesthetics. These medicines block feeling in the body and can decrease your  ability to move freely. Both of these kinds of medicine can cause minor side effects, such as nausea, trouble concentrating, and sleepiness. They can also decrease the baby's heart rate before birth and affect the baby's breathing rate after birth. For this reason, health care providers are careful about when and how much medicine is given. What are specific medicines and procedures that provide pain relief? Local Anesthetics Local anesthetics are used to numb a small area of the body. They may be used along with another kind of anesthetic or used to numb the nerves of the vagina, cervix, and perineum during the second stage of labor. General Anesthetics General anesthetics cause you to lose consciousness so you do not feel pain. They are usually only used for an emergency cesarean delivery. General anesthetics are given through an IV tube and a mask. Pudendal Block A pudendal block is a form of local anesthetic. It may be used to relieve the pain associated with pushing or stretching of the perineum at the time of delivery or to further numb the perineum. A pudendal block is done by injecting numbing medicine through the vaginal wall into a nerve in the pelvis. Epidural Analgesia Epidural analgesia is given through a flexible IV catheter that is inserted into the lower back. Numbing medicine is delivered continuously to the area near your spinal column nerves (epidural space). After having this type of analgesia, you may be able to move your legs but you most likely will not be able to walk. Depending on the amount of medicine given, you may lose all feeling in the lower half of your body, or you may retain some level   of sensation, including the urge to push. Epidural analgesia can be used to provide pain relief for a vaginal birth. Spinal Block A spinal block is similar to epidural analgesia, but the medicine is injected into the spinal fluid instead of the epidural space. A spinal block is only given  once. It starts to relieve pain quickly, but the pain relief lasts only 1-6 hours. Spinal blocks can be used for cesarean deliveries. Combined Spinal-Epidural (CSE) Block A CSE block combines the effects of a spinal block and epidural analgesia. The spinal block works quickly to block all pain. The epidural analgesia provides continuous pain relief, even after the effects of the spinal block have worn off. This information is not intended to replace advice given to you by your health care provider. Make sure you discuss any questions you have with your health care provider. Document Revised: 11/05/2017 Document Reviewed: 04/15/2016 Elsevier Patient Education  2020 [employer]   Vaginal Delivery  Vaginal delivery means that you give birth by pushing your baby out of your birth canal (vagina). A team of health care providers will help you before, during, and after vaginal delivery. Birth experiences are unique for every woman and every pregnancy, and birth experiences vary depending on where you choose to give birth. What happens when I arrive at the birth center or hospital? Once you are in labor and have been admitted into the hospital or birth center, your health care provider may:  Review your pregnancy history and any concerns that you have.  Insert an IV into one of your veins. This may be used to give you fluids and medicines.  Check your blood pressure, pulse, temperature, and heart rate (vital signs).  Check whether your bag of water (amniotic sac) has broken (ruptured).  Talk with you about your birth plan and discuss pain control options. Monitoring Your health care provider may monitor your contractions (uterine monitoring) and your baby's heart rate (fetal monitoring). You may need to be monitored:  Often, but not continuously (intermittently).  All the time or for long periods at a time (continuously). Continuous monitoring may be needed if: ? You are taking certain  medicines, such as medicine to relieve pain or make your contractions stronger. ? You have pregnancy or labor complications. Monitoring may be done by:  Placing a special stethoscope or a handheld monitoring device on your abdomen to check your baby's heartbeat and to check for contractions.  Placing monitors on your abdomen (external monitors) to record your baby's heartbeat and the frequency and length of contractions.  Placing monitors inside your uterus through your vagina (internal monitors) to record your baby's heartbeat and the frequency, length, and strength of your contractions. Depending on the type of monitor, it may remain in your uterus or on your baby's head until birth.  Telemetry. This is a type of continuous monitoring that can be done with external or internal monitors. Instead of having to stay in bed, you are able to move around during telemetry. Physical exam Your health care provider may perform frequent physical exams. This may include:  Checking how and where your baby is positioned in your uterus.  Checking your cervix to determine: ? Whether it is thinning out (effacing). ? Whether it is opening up (dilating). What happens during labor and delivery?  Normal labor and delivery is divided into the following three stages: Stage 1  This is the longest stage of labor.  This stage can last for hours or days.  Throughout  this stage, you will feel contractions. Contractions generally feel mild, infrequent, and irregular at first. They get stronger, more frequent (about every 2-3 minutes), and more regular as you move through this stage.  This stage ends when your cervix is completely dilated to 4 inches (10 cm) and completely effaced. Stage 2  This stage starts once your cervix is completely effaced and dilated and lasts until the delivery of your baby.  This stage may last from 20 minutes to 2 hours.  This is the stage where you will feel an urge to push your  baby out of your vagina.  You may feel stretching and burning pain, especially when the widest part of your baby's head passes through the vaginal opening (crowning).  Once your baby is delivered, the umbilical cord will be clamped and cut. This usually occurs after waiting a period of 1-2 minutes after delivery.  Your baby will be placed on your bare chest (skin-to-skin contact) in an upright position and covered with a warm blanket. Watch your baby for feeding cues, like rooting or sucking, and help the baby to your breast for his or her first feeding. Stage 3  This stage starts immediately after the birth of your baby and ends after you deliver the placenta.  This stage may take anywhere from 5 to 30 minutes.  After your baby has been delivered, you will feel contractions as your body expels the placenta and your uterus contracts to control bleeding. What can I expect after labor and delivery?  After labor is over, you and your baby will be monitored closely until you are ready to go home to ensure that you are both healthy. Your health care team will teach you how to care for yourself and your baby.  You and your baby will stay in the same room (rooming in) during your hospital stay. This will encourage early bonding and successful breastfeeding.  You may continue to receive fluids and medicines through an IV.  Your uterus will be checked and massaged regularly (fundal massage).  You will have some soreness and pain in your abdomen, vagina, and the area of skin between your vaginal opening and your anus (perineum).  If an incision was made near your vagina (episiotomy) or if you had some vaginal tearing during delivery, cold compresses may be placed on your episiotomy or your tear. This helps to reduce pain and swelling.  You may be given a squirt bottle to use instead of wiping when you go to the bathroom. To use the squirt bottle, follow these steps: ? Before you urinate, fill the  squirt bottle with warm water. Do not use hot water. ? After you urinate, while you are sitting on the toilet, use the squirt bottle to rinse the area around your urethra and vaginal opening. This rinses away any urine and blood. ? Fill the squirt bottle with clean water every time you use the bathroom.  It is normal to have vaginal bleeding after delivery. Wear a sanitary pad for vaginal bleeding and discharge. Summary  Vaginal delivery means that you will give birth by pushing your baby out of your birth canal (vagina).  Your health care provider may monitor your contractions (uterine monitoring) and your baby's heart rate (fetal monitoring).  Your health care provider may perform a physical exam.  Normal labor and delivery is divided into three stages.  After labor is over, you and your baby will be monitored closely until you are ready to go home.  This information is not intended to replace advice given to you by your health care provider. Make sure you discuss any questions you have with your health care provider. Document Revised: 12/28/2017 Document Reviewed: 12/28/2017 Elsevier Patient Education  2020 [employer].   Augmentation of Labor  Augmentation of labor is when steps are taken to stimulate and strengthen contractions of the uterus during labor. This may be done when contractions have slowed down or stopped, delaying progress of labor and delivery of the baby. Before beginning augmentation of labor, your health care provider will evaluate your condition, your baby's condition, the size and position of your baby, and the size of your birth canal. What are some reasons for labor augmentation? Augmentation of labor may be needed when:  You are in labor but your contractions are weak or irregular.  You are in labor but your contractions have stopped. What methods are used for labor augmentation? Labor augmentation may be done by:  Giving medicine that stimulates  contractions (oxytocin). This is given through an IV tube that is inserted into one of your veins.  Breaking the fluid-filled sac that surrounds the fetus (amniotic sac). What are the risks associated with labor augmentation? Some risks of labor augmentation include:  Too much stimulation of the contractions, resulting in continuous, prolonged, or very strong contractions.  Increased risk of infection for you and your baby.  Tearing (rupture) of the uterus.  Breaking off (abruption) of the placenta.  Increased risk of cesarean, forceps, or vacuum delivery.  Excessive bleeding after delivery (postpartum hemorrhage).  Death of the baby (fetal death). What are some reasons for not doing labor augmentation? Augmentation of labor should not be done if:  The baby is too big for the birth canal. This can be confirmed with an ultrasound.  The umbilical cord drops in front of the baby's head or breech part (prolapsed cord).  You have had a cesarean delivery and you had a vertical incision or you do not know what type of incision you had.  You have had surgery on or into your uterus.  You have an active herpes outbreak.  You have cervical cancer.  The placenta blocks the opening of the cervix (placenta previa) or you have other condition that is blocking the cervix or vaginal outlet.  The baby is lying sideways.  Your pelvis is will not permit the passage of the baby.  You are carrying more than two babies. Summary  Augmentation of labor is when steps are taken to stimulate and strengthen contractions of the uterus during labor. This may be done when contractions have slowed down or stopped, delaying progress of labor and delivery of the baby.  Labor augmentation may be done using medicine to stimulate contractions (oxytocin) or by breaking the fluid-filled sac that surrounds the fetus (amniotic sac).  Labor should not be augmented if you have had a cesarean delivery and you had  a vertical incision or you do not know what type of incision you had. This information is not intended to replace advice given to you by your health care provider. Make sure you discuss any questions you have with your health care provider. Document Revised: 09/19/2019 Document Reviewed: 12/28/2016 Elsevier Patient Education  2020 [employer].   Third Trimester of Pregnancy The third trimester is from week 28 through week 40 (months 7 through 9). The third trimester is a time when the unborn baby (fetus) is growing rapidly. At the end of the ninth month, the fetus  is about 20 inches in length and weighs 6-10 pounds. Body changes during your third trimester Your body will continue to go through many changes during pregnancy. The changes vary from woman to woman. During the third trimester:  Your weight will continue to increase. You can expect to gain 25-35 pounds (11-16 kg) by the end of the pregnancy.  You may begin to get stretch marks on your hips, abdomen, and breasts.  You may urinate more often because the fetus is moving lower into your pelvis and pressing on your bladder.  You may develop or continue to have heartburn. This is caused by increased hormones that slow down muscles in the digestive tract.  You may develop or continue to have constipation because increased hormones slow digestion and cause the muscles that push waste through your intestines to relax.  You may develop hemorrhoids. These are swollen veins (varicose veins) in the rectum that can itch or be painful.  You may develop swollen, bulging veins (varicose veins) in your legs.  You may have increased body aches in the pelvis, back, or thighs. This is due to weight gain and increased hormones that are relaxing your joints.  You may have changes in your hair. These can include thickening of your hair, rapid growth, and changes in texture. Some women also have hair loss during or after pregnancy, or hair that feels  dry or thin. Your hair will most likely return to normal after your baby is born.  Your breasts will continue to grow and they will continue to become tender. A yellow fluid (colostrum) may leak from your breasts. This is the first milk you are producing for your baby.  Your belly button may stick out.  You may notice more swelling in your hands, face, or ankles.  You may have increased tingling or numbness in your hands, arms, and legs. The skin on your belly may also feel numb.  You may feel short of breath because of your expanding uterus.  You may have more problems sleeping. This can be caused by the size of your belly, increased need to urinate, and an increase in your body's metabolism.  You may notice the fetus "dropping," or moving lower in your abdomen (lightening).  You may have increased vaginal discharge.  You may notice your joints feel loose and you may have pain around your pelvic bone. What to expect at prenatal visits You will have prenatal exams every 2 weeks until week 36. Then you will have weekly prenatal exams. During a routine prenatal visit:  You will be weighed to make sure you and the baby are growing normally.  Your blood pressure will be taken.  Your abdomen will be measured to track your baby's growth.  The fetal heartbeat will be listened to.  Any test results from the previous visit will be discussed.  You may have a cervical check near your due date to see if your cervix has softened or thinned (effaced).  You will be tested for Group B streptococcus. This happens between 35 and 37 weeks. Your health care provider may ask you:  What your birth plan is.  How you are feeling.  If you are feeling the baby move.  If you have had any abnormal symptoms, such as leaking fluid, bleeding, severe headaches, or abdominal cramping.  If you are using any tobacco products, including cigarettes, chewing tobacco, and electronic cigarettes.  If you have  any questions. Other tests or screenings that may be performed  during your third trimester include:  Blood tests that check for low iron levels (anemia).  Fetal testing to check the health, activity level, and growth of the fetus. Testing is done if you have certain medical conditions or if there are problems during the pregnancy.  Nonstress test (NST). This test checks the health of your baby to make sure there are no signs of problems, such as the baby not getting enough oxygen. During this test, a belt is placed around your belly. The baby is made to move, and its heart rate is monitored during movement. What is false labor? False labor is a condition in which you feel small, irregular tightenings of the muscles in the womb (contractions) that usually go away with rest, changing position, or drinking water. These are called Braxton Hicks contractions. Contractions may last for hours, days, or even weeks before true labor sets in. If contractions come at regular intervals, become more frequent, increase in intensity, or become painful, you should see your health care provider. What are the signs of labor?  Abdominal cramps.  Regular contractions that start at 10 minutes apart and become stronger and more frequent with time.  Contractions that start on the top of the uterus and spread down to the lower abdomen and back.  Increased pelvic pressure and dull back pain.  A watery or bloody mucus discharge that comes from the vagina.  Leaking of amniotic fluid. This is also known as your "water breaking." It could be a slow trickle or a gush. Let your health care provider know if it has a color or strange odor. If you have any of these signs, call your health care provider right away, even if it is before your due date. Follow these instructions at home: Medicines  Follow your health care provider's instructions regarding medicine use. Specific medicines may be either safe or unsafe to take  during pregnancy.  Take a prenatal vitamin that contains at least 600 micrograms (mcg) of folic acid.  If you develop constipation, try taking a stool softener if your health care provider approves. Eating and drinking   Eat a balanced diet that includes fresh fruits and vegetables, whole grains, good sources of protein such as meat, eggs, or tofu, and low-fat dairy. Your health care provider will help you determine the amount of weight gain that is right for you.  Avoid raw meat and uncooked cheese. These carry germs that can cause birth defects in the baby.  If you have low calcium intake from food, talk to your health care provider about whether you should take a daily calcium supplement.  Eat four or five small meals rather than three large meals a day.  Limit foods that are high in fat and processed sugars, such as fried and sweet foods.  To prevent constipation: ? Drink enough fluid to keep your urine clear or pale yellow. ? Eat foods that are high in fiber, such as fresh fruits and vegetables, whole grains, and beans. Activity  Exercise only as directed by your health care provider. Most women can continue their usual exercise routine during pregnancy. Try to exercise for 30 minutes at least 5 days a week. Stop exercising if you experience uterine contractions.  Avoid heavy lifting.  Do not exercise in extreme heat or humidity, or at high altitudes.  Wear low-heel, comfortable shoes.  Practice good posture.  You may continue to have sex unless your health care provider tells you otherwise. Relieving pain and discomfort  Take  frequent breaks and rest with your legs elevated if you have leg cramps or low back pain.  Take warm sitz baths to soothe any pain or discomfort caused by hemorrhoids. Use hemorrhoid cream if your health care provider approves.  Wear a good support bra to prevent discomfort from breast tenderness.  If you develop varicose veins: ? Wear support  pantyhose or compression stockings as told by your healthcare provider. ? Elevate your feet for 15 minutes, 3-4 times a day. Prenatal care  Write down your questions. Take them to your prenatal visits.  Keep all your prenatal visits as told by your health care provider. This is important. Safety  Wear your seat belt at all times when driving.  Make a list of emergency phone numbers, including numbers for family, friends, the hospital, and police and fire departments. General instructions  Avoid cat litter boxes and soil used by cats. These carry germs that can cause birth defects in the baby. If you have a cat, ask someone to clean the litter box for you.  Do not travel far distances unless it is absolutely necessary and only with the approval of your health care provider.  Do not use hot tubs, steam rooms, or saunas.  Do not drink alcohol.  Do not use any products that contain nicotine or tobacco, such as cigarettes and e-cigarettes. If you need help quitting, ask your health care provider.  Do not use any medicinal herbs or unprescribed drugs. These chemicals affect the formation and growth of the baby.  Do not douche or use tampons or scented sanitary pads.  Do not cross your legs for long periods of time.  To prepare for the arrival of your baby: ? Take prenatal classes to understand, practice, and ask questions about labor and delivery. ? Make a trial run to the hospital. ? Visit the hospital and tour the maternity area. ? Arrange for maternity or paternity leave through employers. ? Arrange for family and friends to take care of pets while you are in the hospital. ? Purchase a rear-facing car seat and make sure you know how to install it in your car. ? Pack your hospital bag. ? Prepare the baby's nursery. Make sure to remove all pillows and stuffed animals from the baby's crib to prevent suffocation.  Visit your dentist if you have not gone during your pregnancy. Use a  soft toothbrush to brush your teeth and be gentle when you floss. Contact a health care provider if:  You are unsure if you are in labor or if your water has broken.  You become dizzy.  You have mild pelvic cramps, pelvic pressure, or nagging pain in your abdominal area.  You have lower back pain.  You have persistent nausea, vomiting, or diarrhea.  You have an unusual or bad smelling vaginal discharge.  You have pain when you urinate. Get help right away if:  Your water breaks before 37 weeks.  You have regular contractions less than 5 minutes apart before 37 weeks.  You have a fever.  You are leaking fluid from your vagina.  You have spotting or bleeding from your vagina.  You have severe abdominal pain or cramping.  You have rapid weight loss or weight gain.  You have shortness of breath with chest pain.  You notice sudden or extreme swelling of your face, hands, ankles, feet, or legs.  Your baby makes fewer than 10 movements in 2 hours.  You have severe headaches that do not go  away when you take medicine.  You have vision changes. Summary  The third trimester is from week 28 through week 40, months 7 through 9. The third trimester is a time when the unborn baby (fetus) is growing rapidly.  During the third trimester, your discomfort may increase as you and your baby continue to gain weight. You may have abdominal, leg, and back pain, sleeping problems, and an increased need to urinate.  During the third trimester your breasts will keep growing and they will continue to become tender. A yellow fluid (colostrum) may leak from your breasts. This is the first milk you are producing for your baby.  False labor is a condition in which you feel small, irregular tightenings of the muscles in the womb (contractions) that eventually go away. These are called Braxton Hicks contractions. Contractions may last for hours, days, or even weeks before true labor sets in.  Signs  of labor can include: abdominal cramps; regular contractions that start at 10 minutes apart and become stronger and more frequent with time; watery or bloody mucus discharge that comes from the vagina; increased pelvic pressure and dull back pain; and leaking of amniotic fluid. This information is not intended to replace advice given to you by your health care provider. Make sure you discuss any questions you have with your health care provider. Document Revised: 03/16/2019 Document Reviewed: 12/29/2016 Elsevier Patient Education  2020 Elsevier Inc.   Hidradenitis Suppurativa Hidradenitis suppurativa is a long-term (chronic) skin disease. It is similar to a severe form of acne, but it affects areas of the body where acne would be unusual, especially areas of the body where skin rubs against skin and becomes moist. These include:  Underarms.  Groin.  Genital area.  Buttocks.  Upper thighs.  Breasts. Hidradenitis suppurativa may start out as small lumps or pimples caused by blocked sweat glands or hair follicles. Pimples may develop into deep sores that break open (rupture) and drain pus. Over time, affected areas of skin may thicken and become scarred. This condition is rare and does not spread from person to person (non-contagious). What are the causes? The exact cause of this condition is not known. It may be related to:  Female and female hormones.  An overactive disease-fighting system (immune system). The immune system may over-react to blocked hair follicles or sweat glands and cause swelling and pus-filled sores. What increases the risk? You are more likely to develop this condition if you:  Are female.  Are 6411-25 years old.  Have a family history of hidradenitis suppurativa.  Have a personal history of acne.  Are overweight.  Smoke.  Take the medicine lithium. What are the signs or symptoms? The first symptoms are usually painful bumps in the skin, similar to  pimples. The condition may get worse over time (progress), or it may only cause mild symptoms. If the disease progresses, symptoms may include:  Skin bumps getting bigger and growing deeper into the skin.  Bumps rupturing and draining pus.  Itchy, infected skin.  Skin getting thicker and scarred.  Tunnels under the skin (fistulas) where pus drains from a bump.  Pain during daily activities, such as pain during walking if your groin area is affected.  Emotional problems, such as stress or depression. This condition may affect your appearance and your ability or willingness to wear certain clothes or do certain activities. How is this diagnosed? This condition is diagnosed by a health care provider who specializes in skin diseases (dermatologist). You  may be diagnosed based on:  Your symptoms and medical history.  A physical exam.  Testing a pus sample for infection.  Blood tests. How is this treated? Your treatment will depend on how severe your symptoms are. The same treatment will not work for everybody with this condition. You may need to try several treatments to find what works best for you. Treatment may include:  Cleaning and bandaging (dressing) your wounds as needed.  Lifestyle changes, such as new skin care routines.  Taking medicines, such as: ? Antibiotics. ? Acne medicines. ? Medicines to reduce the activity of the immune system. ? A diabetes medicine (metformin). ? Birth control pills, for women. ? Steroids to reduce swelling and pain.  Working with a mental health care provider, if you experience emotional distress due to this condition. If you have severe symptoms that do not get better with medicine, you may need surgery. Surgery may involve:  Using a laser to clear the skin and remove hair follicles.  Opening and draining deep sores.  Removing the areas of skin that are diseased and scarred. Follow these instructions at home: Medicines   Take  over-the-counter and prescription medicines only as told by your health care provider.  If you were prescribed an antibiotic medicine, take it as told by your health care provider. Do not stop taking the antibiotic even if your condition improves. Skin care  If you have open wounds, cover them with a clean dressing as told by your health care provider. Keep wounds clean by washing them gently with soap and water when you bathe.  Do not shave the areas where you get hidradenitis suppurativa.  Do not wear deodorant.  Wear loose-fitting clothes.  Try to avoid getting overheated or sweaty. If you get sweaty or wet, change into clean, dry clothes as soon as you can.  To help relieve pain and itchiness, cover sore areas with a warm, clean washcloth (warm compress) for 5-10 minutes as often as needed.  If told by your health care provider, take a bleach bath twice a week: ? Fill your bathtub halfway with water. ? Pour in  cup of unscented household bleach. ? Soak in the tub for 5-10 minutes. ? Only soak from the neck down. Avoid water on your face and hair. ? Shower to rinse off the bleach from your skin. General instructions  Learn as much as you can about your disease so that you have an active role in your treatment. Work closely with your health care provider to find treatments that work for you.  If you are overweight, work with your health care provider to lose weight as recommended.  Do not use any products that contain nicotine or tobacco, such as cigarettes and e-cigarettes. If you need help quitting, ask your health care provider.  If you struggle with living with this condition, talk with your health care provider or work with a mental health care provider as recommended.  Keep all follow-up visits as told by your health care provider. This is important. Where to find more information  Hidradenitis Suppurativa Foundation, Inc.: https://www.hs-foundation.org/ Contact a health  care provider if you have:  A flare-up of hidradenitis suppurativa.  A fever or chills.  Trouble controlling your symptoms at home.  Trouble doing your daily activities because of your symptoms.  Trouble dealing with emotional problems related to your condition. Summary  Hidradenitis suppurativa is a long-term (chronic) skin disease. It is similar to a severe form of acne, but  it affects areas of the body where acne would be unusual.  The first symptoms are usually painful bumps in the skin, similar to pimples. The condition may get worse over time (progress), or it may only cause mild symptoms.  If you have open wounds, cover them with a clean dressing as told by your health care provider. Keep wounds clean by washing them gently with soap and water when you bathe.  Besides skin care, treatment may include medicines, laser treatment, and surgery. This information is not intended to replace advice given to you by your health care provider. Make sure you discuss any questions you have with your health care provider. Document Revised: 12/01/2017 Document Reviewed: 12/01/2017 Elsevier Patient Education  2020 [employer].

## 2020-07-25 ENCOUNTER — Other Ambulatory Visit: Payer: Self-pay | Admitting: Obstetrics and Gynecology

## 2020-07-26 LAB — CERVICOVAGINAL ANCILLARY ONLY
Chlamydia: NEGATIVE
Comment: NEGATIVE
Comment: NORMAL
Neisseria Gonorrhea: NEGATIVE

## 2020-07-29 LAB — CULTURE, BETA STREP (GROUP B ONLY): Strep Gp B Culture: NEGATIVE

## 2020-08-01 ENCOUNTER — Ambulatory Visit (INDEPENDENT_AMBULATORY_CARE_PROVIDER_SITE_OTHER): Payer: Medicaid Other | Admitting: Obstetrics & Gynecology

## 2020-08-01 ENCOUNTER — Other Ambulatory Visit: Payer: Self-pay

## 2020-08-01 ENCOUNTER — Encounter: Payer: Self-pay | Admitting: Obstetrics & Gynecology

## 2020-08-01 ENCOUNTER — Ambulatory Visit (INDEPENDENT_AMBULATORY_CARE_PROVIDER_SITE_OTHER): Payer: Medicaid Other

## 2020-08-01 VITALS — BP 122/82 | Wt 238.0 lb

## 2020-08-01 DIAGNOSIS — Z3A36 36 weeks gestation of pregnancy: Secondary | ICD-10-CM

## 2020-08-01 DIAGNOSIS — O99210 Obesity complicating pregnancy, unspecified trimester: Secondary | ICD-10-CM

## 2020-08-01 DIAGNOSIS — O99213 Obesity complicating pregnancy, third trimester: Secondary | ICD-10-CM

## 2020-08-01 DIAGNOSIS — O0993 Supervision of high risk pregnancy, unspecified, third trimester: Secondary | ICD-10-CM | POA: Diagnosis not present

## 2020-08-01 DIAGNOSIS — O0990 Supervision of high risk pregnancy, unspecified, unspecified trimester: Secondary | ICD-10-CM

## 2020-08-01 NOTE — Progress Notes (Signed)
  Subjective  Fetal Movement? yes Contractions? no Leaking Fluid? no Vaginal Bleeding? no  Objective  BP 122/82   Wt 238 lb (108 kg)   LMP 11/18/2019 (Exact Date)   BMI 39.61 kg/m  General: NAD Pumonary: no increased work of breathing Abdomen: gravid, non-tender Extremities: no edema Psychiatric: mood appropriate, affect full  A NST procedure was performed with FHR monitoring and a normal baseline established, appropriate time of 20-40 minutes of evaluation, and accels >2 seen w 15x15 characteristics.  Results show a REACTIVE NST.   Review of ULTRASOUND.    I have personally reviewed images and report of recent ultrasound done at [facility].    Plan of management to be discussed with patient.    Vtx, AFI normal  Assessment  24 y.o. G1P0000 at [redacted]w[redacted]d by  08/24/2020, by Last Menstrual Period presenting for routine prenatal visit  Plan   Problem List Items Addressed This Visit      Other   Supervision of high risk pregnancy, antepartum   Obesity affecting pregnancy, antepartum    Other Visit Diagnoses    [redacted] weeks gestation of pregnancy    -  Primary   Supervision of high risk pregnancy in third trimester          pregnancy Problems (from 01/04/20 to present)    Problem Noted Resolved   Supervision of high risk pregnancy, antepartum 01/04/2020 by Tresea Mall, CNM No   Overview Addendum 08/01/2020  2:28 PM by Nadara Mustard, MD    [facility]inic [facility] Prenatal Labs  Dating LMP = 9 week [place] Blood type: B/Positive/-- (02/15 1103)   Genetic Screen NIPS: Normal XY Inheritest negative CF, SMA, and Fragile-X Antibody:Negative (02/15 1103)  Anatomic [place] Complete, normal, female Rubella: 15.00 (02/15 1103) Varicella: Non-Immune  GTT Early: 108                28 wk: 95 RPR: Non Reactive (06/30 1100)   Rhogam  HBsAg: Negative (02/15 1103)   Vaccines TDAP:  07/04/2020                     Flu Shot: HIV: Non Reactive (06/30 1100)   Baby Food Breast                                YBO:FBPZWCHE/-- (08/19 0000)  Contraception uncertain Pap: 01/04/2020 NIL  CBB  no   CS/VBAC NA   Support Person Will          Previous Version   Family history of osteogenesis imperfecta 01/04/2020 by Tresea Mall, CNM No     PNV, FMC, Labor precautions  GBS last week discussed, normal  Contraception options for breastfeeding counseled to pt  Annamarie Major, MD, [facility], [facility] 08/01/2020  2:34 PM

## 2020-08-01 NOTE — Patient Instructions (Signed)

## 2020-08-02 NOTE — Telephone Encounter (Signed)
Please advise 

## 2020-08-03 ENCOUNTER — Observation Stay
Admission: EM | Admit: 2020-08-03 | Discharge: 2020-08-03 | Disposition: A | Discharge status: Home / Self Care | Payer: Medicaid Other | Attending: Obstetrics and Gynecology | Admitting: Obstetrics and Gynecology

## 2020-08-03 ENCOUNTER — Encounter: Payer: Self-pay | Admitting: Obstetrics and Gynecology

## 2020-08-03 ENCOUNTER — Other Ambulatory Visit: Payer: Self-pay

## 2020-08-03 DIAGNOSIS — O42913 Preterm premature rupture of membranes, unspecified as to length of time between rupture and onset of labor, third trimester: Secondary | ICD-10-CM | POA: Diagnosis not present

## 2020-08-03 DIAGNOSIS — O0990 Supervision of high risk pregnancy, unspecified, unspecified trimester: Secondary | ICD-10-CM

## 2020-08-03 DIAGNOSIS — Z79899 Other long term (current) drug therapy: Secondary | ICD-10-CM | POA: Diagnosis not present

## 2020-08-03 DIAGNOSIS — Z3A37 37 weeks gestation of pregnancy: Secondary | ICD-10-CM

## 2020-08-03 DIAGNOSIS — O1203 Gestational edema, third trimester: Secondary | ICD-10-CM | POA: Diagnosis not present

## 2020-08-03 DIAGNOSIS — Z8279 Family history of other congenital malformations, deformations and chromosomal abnormalities: Secondary | ICD-10-CM

## 2020-08-03 LAB — COMPREHENSIVE METABOLIC PANEL
ALT: 15 U/L (ref 0–44)
AST: 18 U/L (ref 15–41)
Albumin: 3 g/dL — ABNORMAL LOW (ref 3.5–5.0)
Alkaline Phosphatase: 132 U/L — ABNORMAL HIGH (ref 38–126)
Anion gap: 10 (ref 5–15)
BUN: 6 mg/dL (ref 6–20)
CO2: 20 mmol/L — ABNORMAL LOW (ref 22–32)
Calcium: 8.8 mg/dL — ABNORMAL LOW (ref 8.9–10.3)
Chloride: 104 mmol/L (ref 98–111)
Creatinine, Ser: 0.52 mg/dL (ref 0.44–1.00)
GFR calc Af Amer: >60 mL/min (ref >60)
GFR calc non Af Amer: >60 mL/min (ref >60)
Glucose, Bld: 79 mg/dL (ref 70–99)
Potassium: 3.6 mmol/L (ref 3.5–5.1)
Sodium: 134 mmol/L — ABNORMAL LOW (ref 135–145)
Total Bilirubin: 0.5 mg/dL (ref 0.3–1.2)
Total Protein: 7.5 g/dL (ref 6.5–8.1)

## 2020-08-03 LAB — RUPTURE OF MEMBRANE (ROM)PLUS: Rom Plus: NEGATIVE

## 2020-08-03 LAB — ABO/RH: ABO/RH(D): B POS

## 2020-08-03 LAB — CBC
HCT: 32.6 % — ABNORMAL LOW (ref 36.0–46.0)
Hemoglobin: 10.5 g/dL — ABNORMAL LOW (ref 12.0–15.0)
MCH: 26.1 pg (ref 26.0–34.0)
MCHC: 32.2 g/dL (ref 30.0–36.0)
MCV: 81.1 fL (ref 80.0–100.0)
Platelets: 201 10*3/uL (ref 150–400)
RBC: 4.02 MIL/uL (ref 3.87–5.11)
RDW: 14.9 % (ref 11.5–15.5)
WBC: 10.9 10*3/uL — ABNORMAL HIGH (ref 4.0–10.5)
nRBC: 0 % (ref 0.0–0.2)

## 2020-08-03 LAB — TYPE AND SCREEN
ABO/RH(D): B POS
Antibody Screen: NEGATIVE

## 2020-08-03 LAB — PROTEIN / CREATININE RATIO, URINE
Creatinine, Urine: 61 mg/dL
Protein Creatinine Ratio: 0.18 mg/mg{Cre} — ABNORMAL HIGH (ref 0.00–0.15)
Total Protein, Urine: 11 mg/dL

## 2020-08-03 NOTE — OB Triage Note (Signed)
Patient arrived in triage with c/o lower extremity swelling that started this afternoon. Reports headache on and off, rates 3/10 at this time. Also reports "a little" epigastric pain that started at the same time and contractions about every 10-15 mins that started around 4pm. Reports good fetal movement. Denies vaginal bleeding. Reports some leaking of fluid since yesterday, clear. Monitors applied and assessing. Initial fht 170.

## 2020-08-03 NOTE — Discharge Summary (Signed)
Physician Final Progress Note  Patient ID: Doris Thomas MRN: [id number] DOB/AGE: 09-06-1996 24 y.o.  Admit date: 08/03/2020 Admitting provider: Conard Novak, MD Discharge date: 08/03/2020   Admission Diagnoses: lower extremity swelling, fluid leaking  Discharge Diagnoses:  Active Problems:   Swelling of lower extremity during pregnancy in third trimester   [redacted] weeks gestation of pregnancy Reactive NST Normal labs Membranes intact  History of Present Illness: The patient is Thomas 24 y.o. female G1P0000 at [redacted]w[redacted]d who presents for lower extremity swelling that started today. She has had Thomas mild headache and no current headache. She has had some leakage of fluid and wears Thomas pad that gets wet and is not soaked. She reports good fetal movement. She denies vaginal bleeding and has noticed some mild occasional contractions.  She is admitted to observation, placed on monitors, labs drawn. Monitoring is reassuring, labs normal. Patient is discharged to home with instructions, comfort measures and precautions.   Past Medical History:  Diagnosis Date  . Vitamin D deficiency     Past Surgical History:  Procedure Laterality Date  . NO PAST SURGERIES      No current facility-administered medications on file prior to encounter.   Current Outpatient Medications on File Prior to Encounter  Medication Sig Dispense Refill  . acetaminophen (TYLENOL) 325 MG tablet Take 650 mg by mouth every 6 (six) hours as needed.    . clindamycin (CLEOCIN-T) 1 % external solution Apply topically 2 (two) times daily. 30 mL 0  . Prenat-FeFum-FePo-FA-Omega 3 (CONCEPT DHA) 53.5-38-1 MG CAPS       No Known Allergies  Social History   Socioeconomic History  . Marital status: Single    Spouse name: Not on file  . Number of children: Not on file  . Years of education: Not on file  . Highest education level: Not on file  Occupational History  . Not on file  Tobacco Use  . Smoking status: Never Smoker  .  Smokeless tobacco: Never Used  Vaping Use  . Vaping Use: Never used  Substance and Sexual Activity  . Alcohol use: No  . Drug use: No  . Sexual activity: Yes    Birth control/protection: None  Other Topics Concern  . Not on file  Social History Narrative  . Not on file   Social Determinants of Health   Financial Resource Strain:   . Difficulty of Paying Living Expenses: Not on file  Food Insecurity:   . Worried About Programme researcher, broadcasting/film/video in the Last Year: Not on file  . Ran Out of Food in the Last Year: Not on file  Transportation Needs:   . Lack of Transportation (Medical): Not on file  . Lack of Transportation (Non-Medical): Not on file  Physical Activity:   . Days of Exercise per Week: Not on file  . Minutes of Exercise per Session: Not on file  Stress:   . Feeling of Stress : Not on file  Social Connections:   . Frequency of Communication with Friends and Family: Not on file  . Frequency of Social Gatherings with Friends and Family: Not on file  . Attends Religious Services: Not on file  . Active Member of Clubs or Organizations: Not on file  . Attends Banker Meetings: Not on file  . Marital Status: Not on file  Intimate Partner Violence:   . Fear of Current or Ex-Partner: Not on file  . Emotionally Abused: Not on file  . Physically Abused:  Not on file  . Sexually Abused: Not on file    Family History  Problem Relation Age of Onset  . Osteogenesis imperfecta Father        6 of patient's siblings also have osteogenesis imperfecta  . Cancer Maternal Grandmother   . Diabetes Paternal Grandmother   . Osteogenesis imperfecta Niece        sister's daughter  . Osteogenesis imperfecta Nephew        brother's son     Review of Systems  Constitutional: Negative for chills and fever.  HENT: Negative for congestion, ear discharge, ear pain, hearing loss, sinus pain and sore throat.   Eyes: Negative for blurred vision and double vision.  Respiratory:  Negative for cough, shortness of breath and wheezing.   Cardiovascular: Positive for leg swelling. Negative for chest pain and palpitations.  Gastrointestinal: Negative for abdominal pain, blood in stool, constipation, diarrhea, heartburn, melena, nausea and vomiting.  Genitourinary: Negative for dysuria, flank pain, frequency, hematuria and urgency.  Musculoskeletal: Negative for back pain, joint pain and myalgias.  Skin: Negative for itching and rash.  Neurological: Negative for dizziness, tingling, tremors, sensory change, speech change, focal weakness, seizures, loss of consciousness, weakness and headaches.  Endo/Heme/Allergies: Negative for environmental allergies. Does not bruise/bleed easily.  Psychiatric/Behavioral: Negative for depression, hallucinations, memory loss, substance abuse and suicidal ideas. The patient is not nervous/anxious and does not have insomnia.      Physical Exam: BP 127/83   Pulse 93   Temp 98.8 F (37.1 C) (Oral)   Resp 18   Ht 5\' 5"  (1.651 m)   Wt 108 kg   LMP 11/18/2019 (Exact Date)   BMI 39.61 kg/m   Constitutional: Well nourished, well developed female in no acute distress.  HEENT: normal Skin: Warm and dry.  Cardiovascular: Regular rate and rhythm.   Extremity: +1 edema lower extremities  Respiratory: Clear to auscultation bilateral. Normal respiratory effort Abdomen: FHT present Back: no CVAT Neuro: DTRs 2+, Cranial nerves grossly intact Psych: Alert and Oriented x3. No memory deficits. Normal mood and affect.  MS: normal gait, normal bilateral lower extremity ROM/strength/stability.  Pelvic exam: deferred  Toco: occasional mild contraction Fetal well being: 130 bpm, moderate variability, +accelerations, -decelerations   Consults: None  Significant Findings/ Diagnostic Studies: labs:  Results for Doris Thomas, Doris Thomas (MRN [id number]) as of 08/03/2020 22:05  Ref. Range 08/03/2020 19:37 08/03/2020 19:59 08/03/2020 20:19  COMPREHENSIVE METABOLIC  PANEL Unknown Rpt (Thomas)    Sodium Latest Ref Range: 135 - 145 mmol/L 134 (L)    Potassium Latest Ref Range: 3.5 - 5.1 mmol/L 3.6    Chloride Latest Ref Range: 98 - 111 mmol/L 104    CO2 Latest Ref Range: 22 - 32 mmol/L 20 (L)    Glucose Latest Ref Range: 70 - 99 mg/dL 79    BUN Latest Ref Range: 6 - 20 mg/dL 6    Creatinine Latest Ref Range: 0.44 - 1.00 mg/dL 4.090.52    Calcium Latest Ref Range: 8.9 - 10.3 mg/dL 8.8 (L)    Anion gap Latest Ref Range: 5 - 15  10    Alkaline Phosphatase Latest Ref Range: 38 - 126 U/L 132 (H)    Albumin Latest Ref Range: 3.5 - 5.0 g/dL 3.0 (L)    AST Latest Ref Range: 15 - 41 U/L 18    ALT Latest Ref Range: 0 - 44 U/L 15    Total Protein Latest Ref Range: 6.5 - 8.1 g/dL 7.5  Total Bilirubin Latest Ref Range: 0.3 - 1.2 mg/dL 0.5    GFR, Est Non African American Latest Ref Range: >60 mL/min >60    GFR, Est African American Latest Ref Range: >60 mL/min >60    WBC Latest Ref Range: 4.0 - 10.5 K/uL 10.9 (H)    RBC Latest Ref Range: 3.87 - 5.11 MIL/uL 4.02    Hemoglobin Latest Ref Range: 12.0 - 15.0 g/dL 09.3 (L)    HCT Latest Ref Range: 36 - 46 % 32.6 (L)    MCV Latest Ref Range: 80.0 - 100.0 fL 81.1    MCH Latest Ref Range: 26.0 - 34.0 pg 26.1    MCHC Latest Ref Range: 30.0 - 36.0 g/dL 23.5    RDW Latest Ref Range: 11.5 - 15.5 % 14.9    Platelets Latest Ref Range: 150 - 400 K/uL 201    nRBC Latest Ref Range: 0.0 - 0.2 % 0.0    Sample Expiration Unknown 08/06/2020,2359...    Antibody Screen Unknown NEG    ABO/RH(D) Unknown B POS  B POS...  Total Protein, Urine Latest Units: mg/dL  11   Protein Creatinine Ratio Latest Ref Range: 0.00 - 0.15 mg/mgCre  0.18 (H)   Creatinine, Urine Latest Units: mg/dL  61   Rom Plus Unknown  NEGATIVE     Procedures: NST  Hospital Course: The patient was admitted to Labor and Delivery Triage for observation.   Discharge Condition: good  Disposition: Discharge disposition: 01-Home or Self Care  Diet: Regular  diet  Discharge Activity: Activity as tolerated  Discharge Instructions    Discharge activity:  No Restrictions   Complete by: As directed    Discharge diet:  No restrictions   Complete by: As directed    Fetal Kick Count:  Lie on our left side for one hour after Thomas meal, and count the number of times your baby kicks.  If it is less than 5 times, get up, move around and drink some juice.  Repeat the test 30 minutes later.  If it is still less than 5 kicks in an hour, notify your doctor.   Complete by: As directed    LABOR:  When conractions begin, you should start to time them from the beginning of one contraction to the beginning  of the next.  When contractions are 5 - 10 minutes apart or less and have been regular for at least an hour, you should call your health care provider.   Complete by: As directed    No sexual activity restrictions   Complete by: As directed    Notify physician for bleeding from the vagina   Complete by: As directed    Notify physician for blurring of vision or spots before the eyes   Complete by: As directed    Notify physician for chills or fever   Complete by: As directed    Notify physician for fainting spells, "black outs" or loss of consciousness   Complete by: As directed    Notify physician for increase in vaginal discharge   Complete by: As directed    Notify physician for leaking of fluid   Complete by: As directed    Notify physician for pain or burning when urinating   Complete by: As directed    Notify physician for pelvic pressure (sudden increase)   Complete by: As directed    Notify physician for severe or continued nausea or vomiting   Complete by: As directed    Notify  physician for sudden gushing of fluid from the vagina (with or without continued leaking)   Complete by: As directed    Notify physician for sudden, constant, or occasional abdominal pain   Complete by: As directed    Notify physician if baby moving less than usual    Complete by: As directed      Allergies as of 08/03/2020   No Known Allergies     Medication List    TAKE these medications   acetaminophen 325 MG tablet Commonly known as: TYLENOL Take 650 mg by mouth every 6 (six) hours as needed.   clindamycin 1 % external solution Commonly known as: Cleocin-T Apply topically 2 (two) times daily.   Concept DHA 53.5-38-1 MG Caps       Follow-up Information    [facility]. Go to.   Specialty: Obstetrics and Gynecology Why: scheduled appointment Contact information: [address] [place] [address] [phone]              Total time spent taking care of this patient: 30 minutes  Signed: Tresea Mall, CNM  08/03/2020, 9:51 PM

## 2020-08-08 ENCOUNTER — Other Ambulatory Visit: Payer: Self-pay

## 2020-08-08 ENCOUNTER — Ambulatory Visit (INDEPENDENT_AMBULATORY_CARE_PROVIDER_SITE_OTHER): Payer: Medicaid Other | Admitting: Advanced Practice Midwife

## 2020-08-08 ENCOUNTER — Encounter: Payer: Self-pay | Admitting: Advanced Practice Midwife

## 2020-08-08 VITALS — BP 130/90 | Wt 242.0 lb

## 2020-08-08 DIAGNOSIS — O0993 Supervision of high risk pregnancy, unspecified, third trimester: Secondary | ICD-10-CM

## 2020-08-08 DIAGNOSIS — Z3A37 37 weeks gestation of pregnancy: Secondary | ICD-10-CM

## 2020-08-08 DIAGNOSIS — O99210 Obesity complicating pregnancy, unspecified trimester: Secondary | ICD-10-CM

## 2020-08-08 LAB — POCT URINALYSIS DIPSTICK OB: Glucose, UA: NEGATIVE

## 2020-08-08 NOTE — Progress Notes (Signed)
Routine Prenatal Care Visit  Subjective  Doris Thomas is a 24 y.o. G1P0000 at [redacted]w[redacted]d being seen today for ongoing prenatal care.  She is currently monitored for the following issues for this high-risk pregnancy and has Vitamin D deficiency; Encounter for contraceptive management; Supervision of high risk pregnancy, antepartum; Family history of osteogenesis imperfecta; Obesity affecting pregnancy, antepartum; Swelling of lower extremity during pregnancy in third trimester; and [redacted] weeks gestation of pregnancy on their problem list.  ----------------------------------------------------------------------------------- Patient reports improvement in swelling. She denies shortness of breath. She has occasional headache- none currently. She has seen spots- not currently. She does have some upper abdominal pain bilateral.   Contractions: Irregular. Vag. Bleeding: None.  Movement: Present. Leaking Fluid denies.  ----------------------------------------------------------------------------------- The following portions of the patient's history were reviewed and updated as appropriate: allergies, current medications, past family history, past medical history, past social history, past surgical history and problem list. Problem list updated.  Objective  Blood pressure 130/90, weight 242 lb (109.8 kg), last menstrual period 11/18/2019. Blood pressure recheck: 130/78, pulse ox 99, pulse 106  Pregravid weight 190 lb (86.2 kg) Total Weight Gain 52 lb (23.6 kg) Urinalysis: Urine Protein Trace  Urine Glucose Negative  Fetal Status: Fetal Heart Rate (bpm): 143 Fundal Height: 36 cm Movement: Present  Presentation: Vertex  General:  Alert, oriented and cooperative. Patient is in no acute distress.  Skin: Skin is warm and dry. No rash noted.   Cardiovascular: Normal heart rate noted  Respiratory: Normal respiratory effort, no problems with respiration noted  Abdomen: Soft, gravid, appropriate for gestational age.  Pain/Pressure: Present     Pelvic:  Cervical exam performed Dilation: 1.5 Effacement (%): 40 Station: -3  Extremities: Normal range of motion.     Mental Status: Normal mood and affect. Normal behavior. Normal judgment and thought content.   Assessment   23 y.o. G1P0000 at [redacted]w[redacted]d by  08/24/2020, by Last Menstrual Period presenting for routine prenatal visit  Plan   pregnancy Problems (from 01/04/20 to present)    Problem Noted Resolved   Supervision of high risk pregnancy, antepartum 01/04/2020 by Tresea Mall, CNM No   Overview Addendum 08/01/2020  2:28 PM by Nadara Mustard, MD    [facility]inic [facility] Prenatal Labs  Dating LMP = 9 week [place] Blood type: B/Positive/-- (02/15 1103)   Genetic Screen NIPS: Normal XY Inheritest negative CF, SMA, and Fragile-X Antibody:Negative (02/15 1103)  Anatomic [place] Complete, normal, female Rubella: 15.00 (02/15 1103) Varicella: Non-Immune  GTT Early: 108                28 wk: 95 RPR: Non Reactive (06/30 1100)   Rhogam  HBsAg: Negative (02/15 1103)   Vaccines TDAP:  07/04/2020                     Flu Shot: HIV: Non Reactive (06/30 1100)   Baby Food Breast                               UVO:ZDGUYQIH/-- (08/19 0000)  Contraception uncertain Pap: 01/04/2020 NIL  CBB  no   CS/VBAC NA   Support Person Will          Previous Version   Family history of osteogenesis imperfecta 01/04/2020 by Tresea Mall, CNM No       Term labor symptoms and general obstetric precautions including but not limited to vaginal bleeding, contractions, leaking of fluid and  fetal movement were reviewed in detail with the patient.   Return in about 1 week (around 08/15/2020) for growth/afi/nst/rob.  Tresea Mall, CNM 08/08/2020 5:04 PM

## 2020-08-08 NOTE — Progress Notes (Signed)
C/o light pressure in chest like something is sitting there; swelling feet and hands.rj

## 2020-08-15 ENCOUNTER — Other Ambulatory Visit: Payer: Self-pay | Admitting: Obstetrics and Gynecology

## 2020-08-15 ENCOUNTER — Ambulatory Visit (INDEPENDENT_AMBULATORY_CARE_PROVIDER_SITE_OTHER): Payer: Medicaid Other

## 2020-08-15 ENCOUNTER — Other Ambulatory Visit: Payer: Self-pay

## 2020-08-15 ENCOUNTER — Encounter: Payer: Self-pay | Admitting: Obstetrics and Gynecology

## 2020-08-15 ENCOUNTER — Ambulatory Visit (INDEPENDENT_AMBULATORY_CARE_PROVIDER_SITE_OTHER): Payer: Medicaid Other | Admitting: Obstetrics and Gynecology

## 2020-08-15 ENCOUNTER — Inpatient Hospital Stay
Admission: EM | Admit: 2020-08-15 | Discharge: 2020-08-18 | DRG: 806 | Disposition: A | Discharge status: Home / Self Care | Payer: Medicaid Other | Attending: Advanced Practice Midwife | Admitting: Advanced Practice Midwife

## 2020-08-15 VITALS — BP 128/72 | Ht 65.0 in | Wt 243.8 lb

## 2020-08-15 DIAGNOSIS — O1002 Pre-existing essential hypertension complicating childbirth: Secondary | ICD-10-CM | POA: Diagnosis present

## 2020-08-15 DIAGNOSIS — Z3A38 38 weeks gestation of pregnancy: Secondary | ICD-10-CM | POA: Diagnosis not present

## 2020-08-15 DIAGNOSIS — O99210 Obesity complicating pregnancy, unspecified trimester: Secondary | ICD-10-CM | POA: Diagnosis present

## 2020-08-15 DIAGNOSIS — O365930 Maternal care for other known or suspected poor fetal growth, third trimester, not applicable or unspecified: Secondary | ICD-10-CM

## 2020-08-15 DIAGNOSIS — O0990 Supervision of high risk pregnancy, unspecified, unspecified trimester: Secondary | ICD-10-CM

## 2020-08-15 DIAGNOSIS — O43123 Velamentous insertion of umbilical cord, third trimester: Secondary | ICD-10-CM | POA: Diagnosis present

## 2020-08-15 DIAGNOSIS — O0993 Supervision of high risk pregnancy, unspecified, third trimester: Secondary | ICD-10-CM | POA: Diagnosis not present

## 2020-08-15 DIAGNOSIS — Z8279 Family history of other congenital malformations, deformations and chromosomal abnormalities: Secondary | ICD-10-CM

## 2020-08-15 DIAGNOSIS — Z20822 Contact with and (suspected) exposure to covid-19: Secondary | ICD-10-CM | POA: Diagnosis present

## 2020-08-15 HISTORY — DX: Other specified health status: Z78.9

## 2020-08-15 LAB — COMPREHENSIVE METABOLIC PANEL
ALT: 14 U/L (ref 0–44)
AST: 25 U/L (ref 15–41)
Albumin: 2.9 g/dL — ABNORMAL LOW (ref 3.5–5.0)
Alkaline Phosphatase: 156 U/L — ABNORMAL HIGH (ref 38–126)
Anion gap: 11 (ref 5–15)
BUN: 7 mg/dL (ref 6–20)
CO2: 18 mmol/L — ABNORMAL LOW (ref 22–32)
Calcium: 8.9 mg/dL (ref 8.9–10.3)
Chloride: 104 mmol/L (ref 98–111)
Creatinine, Ser: 0.73 mg/dL (ref 0.44–1.00)
GFR calc Af Amer: >60 mL/min (ref >60)
GFR calc non Af Amer: >60 mL/min (ref >60)
Glucose, Bld: 118 mg/dL — ABNORMAL HIGH (ref 70–99)
Potassium: 3.6 mmol/L (ref 3.5–5.1)
Sodium: 133 mmol/L — ABNORMAL LOW (ref 135–145)
Total Bilirubin: 0.8 mg/dL (ref 0.3–1.2)
Total Protein: 7.5 g/dL (ref 6.5–8.1)

## 2020-08-15 LAB — POCT URINALYSIS DIPSTICK OB
Glucose, UA: NEGATIVE
POC,PROTEIN,UA: NEGATIVE

## 2020-08-15 LAB — CBC
HCT: 38.2 % (ref 36.0–46.0)
Hemoglobin: 12.1 g/dL (ref 12.0–15.0)
MCH: 25.3 pg — ABNORMAL LOW (ref 26.0–34.0)
MCHC: 31.7 g/dL (ref 30.0–36.0)
MCV: 79.7 fL — ABNORMAL LOW (ref 80.0–100.0)
Platelets: 194 10*3/uL (ref 150–400)
RBC: 4.79 MIL/uL (ref 3.87–5.11)
RDW: 15.4 % (ref 11.5–15.5)
WBC: 10.2 10*3/uL (ref 4.0–10.5)
nRBC: 0 % (ref 0.0–0.2)

## 2020-08-15 LAB — SARS CORONAVIRUS 2 BY RT PCR (HOSPITAL ORDER, PERFORMED IN ~~LOC~~ HOSPITAL LAB): SARS Coronavirus 2: NEGATIVE

## 2020-08-15 LAB — PROTEIN / CREATININE RATIO, URINE
Creatinine, Urine: 67 mg/dL
Protein Creatinine Ratio: 0.12 mg/mg{Cre} (ref 0.00–0.15)
Total Protein, Urine: 8 mg/dL

## 2020-08-15 MED ORDER — ONDANSETRON HCL 4 MG/2ML IJ SOLN: 4.0000 mg | Freq: Four times a day (QID) | INTRAMUSCULAR | Status: DC | PRN

## 2020-08-15 MED ORDER — LACTATED RINGERS IV SOLN: 500.0000 mL | INTRAVENOUS | Status: DC | PRN

## 2020-08-15 MED ORDER — SOD CITRATE-CITRIC ACID 500-334 MG/5ML PO SOLN: 30.0000 mL | ORAL | Status: DC | PRN

## 2020-08-15 MED ORDER — MISOPROSTOL 100 MCG PO TABS
25.0000 ug | ORAL_TABLET | ORAL | Status: DC | PRN
  Administered 2020-08-15 – 2020-08-16 (×2): 25 ug via VAGINAL
  Filled 2020-08-15 (×2): qty 1

## 2020-08-15 MED ORDER — LACTATED RINGERS IV SOLN: INTRAVENOUS | Status: DC

## 2020-08-15 MED ORDER — TERBUTALINE SULFATE 1 MG/ML IJ SOLN: 0.2500 mg | Freq: Once | INTRAMUSCULAR | Status: DC | PRN

## 2020-08-15 MED ORDER — MISOPROSTOL 25 MCG QUARTER TABLET
ORAL_TABLET | ORAL | Status: AC
  Filled 2020-08-15: qty 1

## 2020-08-15 MED ORDER — LIDOCAINE HCL (PF) 1 % IJ SOLN: 30.0000 mL | INTRAMUSCULAR | Status: DC | PRN

## 2020-08-15 MED ORDER — OXYTOCIN-SODIUM CHLORIDE 30-0.9 UT/500ML-% IV SOLN
2.5000 [IU]/h | INTRAVENOUS | Status: DC
  Filled 2020-08-15: qty 500

## 2020-08-15 MED ORDER — OXYTOCIN-SODIUM CHLORIDE 30-0.9 UT/500ML-% IV SOLN
1.0000 m[IU]/min | INTRAVENOUS | Status: DC
  Administered 2020-08-15 – 2020-08-16 (×2): 1 m[IU]/min via INTRAVENOUS
  Filled 2020-08-15: qty 1000

## 2020-08-15 MED ORDER — AMMONIA AROMATIC IN INHA
RESPIRATORY_TRACT | Status: AC
  Filled 2020-08-15: qty 10

## 2020-08-15 MED ORDER — OXYTOCIN BOLUS FROM INFUSION
333.0000 mL | Freq: Once | INTRAVENOUS | Status: AC
  Administered 2020-08-16: 333 mL via INTRAVENOUS

## 2020-08-15 MED ORDER — LIDOCAINE HCL (PF) 1 % IJ SOLN
INTRAMUSCULAR | Status: AC
  Filled 2020-08-15: qty 30

## 2020-08-15 MED ORDER — OXYTOCIN 10 UNIT/ML IJ SOLN: 10.0000 [IU] | Freq: Once | INTRAMUSCULAR | Status: DC

## 2020-08-15 MED ORDER — OXYTOCIN 10 UNIT/ML IJ SOLN
INTRAMUSCULAR | Status: AC
  Filled 2020-08-15: qty 2

## 2020-08-15 NOTE — Progress Notes (Signed)
Routine Prenatal Care Visit  Subjective  Doris Thomas is a 24 y.o. G1P0000 at [redacted]w[redacted]d being seen today for ongoing prenatal care.  She is currently monitored for the following issues for this high-risk pregnancy and has Vitamin D deficiency; Encounter for contraceptive management; Supervision of high risk pregnancy, antepartum; Family history of osteogenesis imperfecta; Obesity affecting pregnancy, antepartum; Swelling of lower extremity during pregnancy in third trimester; [redacted] weeks gestation of pregnancy; Intrauterine growth restriction affecting antepartum care of mother in third trimester, not applicable or unspecified fetus; [redacted] weeks gestation of pregnancy; and Intrauterine growth restriction (IUGR) affecting care of mother, third trimester, single gestation on their problem list.  ----------------------------------------------------------------------------------- Patient reports elevated BP at home, swelling in lower extremeties.   Contractions: Not present. Vag. Bleeding: None.  Movement: Present. Denies leaking of fluid.  ----------------------------------------------------------------------------------- The following portions of the patient's history were reviewed and updated as appropriate: allergies, current medications, past family history, past medical history, past social history, past surgical history and problem list. Problem list updated.   Objective  Blood pressure 128/72, height 5\' 5"  (1.651 m), weight 243 lb 12.8 oz (110.6 kg), last menstrual period 11/18/2019. Pregravid weight 190 lb (86.2 kg) Total Weight Gain 53 lb 12.8 oz (24.4 kg) Urinalysis:      Fetal Status:     Movement: Present     General:  Alert, oriented and cooperative. Patient is in no acute distress.  Skin: Skin is warm and dry. No rash noted.   Cardiovascular: Normal heart rate noted  Respiratory: Normal respiratory effort, no problems with respiration noted  Abdomen: Soft, gravid, appropriate for  gestational age. Pain/Pressure: Present     Pelvic:  Cervical exam deferred        Extremities: Normal range of motion.     Mental Status: Normal mood and affect. Normal behavior. Normal judgment and thought content.     Assessment   24 y.o. G1P0000 at [redacted]w[redacted]d by  08/24/2020, by Last Menstrual Period presenting for routine prenatal visit  Plan   pregnancy Problems (from 01/04/20 to present)    Problem Noted Resolved   Intrauterine growth restriction affecting antepartum care of mother in third trimester, not applicable or unspecified fetus 08/15/2020 by 10/15/2020, MD No   Supervision of high risk pregnancy, antepartum 01/04/2020 by 01/06/2020, CNM No   Overview Addendum 08/15/2020 11:22 AM by 10/15/2020, MD    [facility]inic [facility] Prenatal Labs  Dating LMP = 9 week Natale Milch Blood type: B/Positive/-- (02/15 1103)   Genetic Screen NIPS: Normal XY Inheritest negative CF, SMA, and Fragile-X Antibody:Negative (02/15 1103)  Anatomic 04-14-2002 Complete, normal, female Rubella: 15.00 (02/15 1103) Varicella: Non-Immune  GTT Early: 108                28 wk: 95 RPR: Non Reactive (06/30 1100)   Rhogam  not needed HBsAg: Negative (02/15 1103)   Vaccines TDAP:  07/04/2020                     Flu Shot: HIV: Non Reactive (06/30 1100)   Baby Food Breast                               09-10-1985-- (08/19 0000)  Contraception uncertain Pap: 01/04/2020 NIL  CBB  no   CS/VBAC NA   Support Person Will          Previous Version  Family history of osteogenesis imperfecta 01/04/2020 by Tresea Mall, CNM No       NST: 140 bpm baseline, moderate variability, 15x15 accelerations, one variable decelerations. Tocometer : quiet  Sent to L&D for IOL secondary to IUGR Discussed fetal right kidney absence with MFM, they recommended follow up US for infant after delivery.  Patient concerned regarding elevated BP at home. Discussed that BP normal today and we can monitor closely for elevated BP at the  hospital. Patient reports swelling of bilateral lower extremities and occasional headaches.   Gestational age appropriate obstetric precautions including but not limited to vaginal bleeding, contractions, leaking of fluid and fetal movement were reviewed in detail with the patient.    Return in about 1 week (around 08/22/2020), or if symptoms worsen or fail to improve.  Natale Milch MD [facility], [facility] 08/15/2020, 1:45 PM

## 2020-08-15 NOTE — H&P (Signed)
OB History & Physical   History of Present Illness:  Chief Complaint: sent over from clinic for induction of labor  HPI:  Doris Thomas is a 24 y.o. G1P0000 female at [redacted]w[redacted]d dated by LMP consistent with 9 week ultrasound.  Her pregnancy has been complicated by questionable history of hypertension prior to pregnancy, possible absence of right kidney.    She denies contractions.   She denies leakage of fluid.   She denies vaginal bleeding.   She reports fetal movement.  She denies headache, visual changes, and right upper/epigastric quadrant pain.   Total weight gain for pregnancy: 24.3 kg   Obstetrical Problem List: pregnancy Problems (from 01/04/20 to present)    Problem Noted Resolved   Intrauterine growth restriction affecting antepartum care of mother in third trimester, not applicable or unspecified fetus 08/15/2020 by Conard Novak, MD No   Supervision of high risk pregnancy, antepartum 01/04/2020 by Tresea Mall, CNM No   Overview Addendum 08/15/2020  1:45 PM by Natale Milch, MD    [facility]inic [facility] Prenatal Labs  Dating LMP = 9 week [place] Blood type: B/Positive/-- (02/15 1103)   Genetic Screen NIPS: Normal XY Inheritest negative CF, SMA, and Fragile-X Antibody:Negative (02/15 1103)  Anatomic [place] Complete, female Fetal kidney absent in third trimester Rubella: 15.00 (02/15 1103)   Varicella: Non-Immune  GTT Early: 108                28 wk: 95 RPR: Non Reactive (06/30 1100)   Rhogam  not needed HBsAg: Negative (02/15 1103)   Vaccines TDAP:  07/04/2020                     Flu Shot: HIV: Non Reactive (06/30 1100)   Baby Food Breast                               OAC:ZYSAYTKZ/-- (08/19 0000)  Contraception uncertain Pap: 01/04/2020 NIL  CBB  no   CS/VBAC NA   Support Person Will        Previous Version   Family history of osteogenesis imperfecta 01/04/2020 by Tresea Mall, CNM No       Maternal Medical History:   Past Medical History:  Diagnosis Date  . Medical history  non-contributory   . Vitamin D deficiency     Past Surgical History:  Procedure Laterality Date  . NO PAST SURGERIES     Allergies: No Known Allergies  Prior to Admission medications   Medication Sig Start Date End Date Taking? Authorizing Provider  acetaminophen (TYLENOL) 325 MG tablet Take 650 mg by mouth every 6 (six) hours as needed.   Yes [provider]  clindamycin (CLEOCIN-T) 1 % external solution Apply topically 2 (two) times daily. 07/24/20  Yes Schuman, Jaquelyn Bitter, MD  Prenat-FeFum-FePo-FA-Omega 3 (CONCEPT DHA) 53.5-38-1 MG CAPS  01/08/20  Yes [provider]    OB History  Gravida Para Term Preterm AB Living  1 0 0 0 0 0  SAB TAB Ectopic Multiple Live Births  0 0 0 0 0    # Outcome Date GA Lbr Len/2nd Weight Sex Delivery Anes PTL Lv  1 Current             Prenatal care site: [facility]  Social History: She  reports that she has never smoked. She has never used smokeless tobacco. She reports that she does not drink alcohol and does  not use drugs.  Family History: family history includes Cancer in her maternal grandmother; Diabetes in her paternal grandmother; Osteogenesis imperfecta in her father, nephew, and niece.   Review of Systems:  Review of Systems  Constitutional: Negative.   HENT: Negative.   Eyes: Negative.   Respiratory: Negative.   Cardiovascular: Negative.   Gastrointestinal: Negative.   Genitourinary: Negative.   Musculoskeletal: Negative.   Skin: Negative.   Neurological: Negative.   Psychiatric/Behavioral: Negative.      Physical Exam:  BP (!) 145/91 (BP Location: Left Arm)   Pulse (!) 112   Temp 99.1 F (37.3 C) (Oral)   Resp 16   Ht 5\' 5"  (1.651 m)   Wt 110.5 kg   LMP 11/18/2019 (Exact Date)   BMI 40.54 kg/m   Physical Exam Constitutional:      General: She is not in acute distress.    Appearance: Normal appearance. She is well-developed.  Genitourinary:     Genitourinary Comments: Cervix  closed/thick/high per RN  HENT:     Head: Normocephalic and atraumatic.  Eyes:     General: No scleral icterus.    Conjunctiva/sclera: Conjunctivae normal.  Cardiovascular:     Rate and Rhythm: Normal rate and regular rhythm.     Heart sounds: No murmur heard.  No friction rub. No gallop.   Pulmonary:     Effort: Pulmonary effort is normal. No respiratory distress.     Breath sounds: Normal breath sounds. No wheezing or rales.  Abdominal:     General: Bowel sounds are normal. There is no distension.     Palpations: Abdomen is soft. There is no mass.     Tenderness: There is no abdominal tenderness. There is no guarding or rebound.  Musculoskeletal:        General: Normal range of motion.     Cervical back: Normal range of motion and neck supple.  Neurological:     General: No focal deficit present.     Mental Status: She is alert and oriented to person, place, and time.     Cranial Nerves: No cranial nerve deficit.  Skin:    General: Skin is warm and dry.     Findings: No erythema.  Psychiatric:        Mood and Affect: Mood normal.        Behavior: Behavior normal.        Judgment: Judgment normal.     Baseline FHR: 135 beats/min   Variability: moderate   Accelerations: present   Decelerations: present (episodic vs periodic variable decelerations, deep, but quickly resolved) Contractions: absent frequency: n/a Overall assessment: cat 1 overall with a couple of periods of cat 2  Bedside Ultrasound:  Number of Fetus: 1  Presentation: cephlic  Fluid: normal  Placental Location: anterior  Lab Results  Component Value Date   SARSCOV2NAA NEGATIVE 08/15/2020    Assessment:  Doris Thomas is a 24 y.o. G109P0000 female at [redacted]w[redacted]d with fetal growth restriction, chronic hypertension vs gestational hypertension without severe features.   Plan:  1. Admit to Labor & Delivery  2. CBC, T&S, Clrs, IVF 3. GBS negative.   4. Fetwal well-being: reassuring overall 5. Will perform  contraction stress test. If tolerates, will start with misoprostol for cervical ripening.    [redacted]w[redacted]d, MD 08/15/2020 4:19 PM

## 2020-08-16 ENCOUNTER — Encounter: Payer: Self-pay | Admitting: Obstetrics and Gynecology

## 2020-08-16 ENCOUNTER — Inpatient Hospital Stay: Payer: Medicaid Other | Admitting: Anesthesiology

## 2020-08-16 DIAGNOSIS — O365930 Maternal care for other known or suspected poor fetal growth, third trimester, not applicable or unspecified: Principal | ICD-10-CM

## 2020-08-16 DIAGNOSIS — Z3A38 38 weeks gestation of pregnancy: Secondary | ICD-10-CM

## 2020-08-16 LAB — RPR: RPR Ser Ql: NONREACTIVE

## 2020-08-16 LAB — TYPE AND SCREEN
ABO/RH(D): B POS
Antibody Screen: NEGATIVE

## 2020-08-16 LAB — COMPREHENSIVE METABOLIC PANEL
ALT: 15 U/L (ref 0–44)
AST: 22 U/L (ref 15–41)
Albumin: 2.7 g/dL — ABNORMAL LOW (ref 3.5–5.0)
Alkaline Phosphatase: 157 U/L — ABNORMAL HIGH (ref 38–126)
Anion gap: 6 (ref 5–15)
BUN: 7 mg/dL (ref 6–20)
CO2: 24 mmol/L (ref 22–32)
Calcium: 8.8 mg/dL — ABNORMAL LOW (ref 8.9–10.3)
Chloride: 103 mmol/L (ref 98–111)
Creatinine, Ser: 0.63 mg/dL (ref 0.44–1.00)
GFR calc Af Amer: >60 mL/min (ref >60)
GFR calc non Af Amer: >60 mL/min (ref >60)
Glucose, Bld: 129 mg/dL — ABNORMAL HIGH (ref 70–99)
Potassium: 4 mmol/L (ref 3.5–5.1)
Sodium: 133 mmol/L — ABNORMAL LOW (ref 135–145)
Total Bilirubin: 0.6 mg/dL (ref 0.3–1.2)
Total Protein: 7.1 g/dL (ref 6.5–8.1)

## 2020-08-16 LAB — CBC
HCT: 33.9 % — ABNORMAL LOW (ref 36.0–46.0)
Hemoglobin: 10.9 g/dL — ABNORMAL LOW (ref 12.0–15.0)
MCH: 25.4 pg — ABNORMAL LOW (ref 26.0–34.0)
MCHC: 32.2 g/dL (ref 30.0–36.0)
MCV: 79 fL — ABNORMAL LOW (ref 80.0–100.0)
Platelets: 187 10*3/uL (ref 150–400)
RBC: 4.29 MIL/uL (ref 3.87–5.11)
RDW: 15.5 % (ref 11.5–15.5)
WBC: 15.2 10*3/uL — ABNORMAL HIGH (ref 4.0–10.5)
nRBC: 0 % (ref 0.0–0.2)

## 2020-08-16 LAB — PROTEIN / CREATININE RATIO, URINE
Creatinine, Urine: 103 mg/dL
Protein Creatinine Ratio: 0.24 mg/mg{Cre} — ABNORMAL HIGH (ref 0.00–0.15)
Total Protein, Urine: 25 mg/dL

## 2020-08-16 MED ORDER — DIPHENHYDRAMINE HCL 50 MG/ML IJ SOLN: 12.5000 mg | INTRAMUSCULAR | Status: DC | PRN

## 2020-08-16 MED ORDER — BUPIVACAINE HCL (PF) 0.25 % IJ SOLN
INTRAMUSCULAR | Status: DC | PRN
  Administered 2020-08-16 (×2): 3 mL via EPIDURAL

## 2020-08-16 MED ORDER — PHENYLEPHRINE 40 MCG/ML (10ML) SYRINGE FOR IV PUSH (FOR BLOOD PRESSURE SUPPORT)
80.0000 ug | PREFILLED_SYRINGE | INTRAVENOUS | Status: DC | PRN
  Filled 2020-08-16: qty 10

## 2020-08-16 MED ORDER — FENTANYL 2.5 MCG/ML W/ROPIVACAINE 0.15% IN NS 100 ML EPIDURAL (ARMC)
EPIDURAL | Status: AC
Start: 2020-08-16 — End: 2020-08-16
  Filled 2020-08-16: qty 100

## 2020-08-16 MED ORDER — EPHEDRINE 5 MG/ML INJ
10.0000 mg | INTRAVENOUS | Status: DC | PRN
  Filled 2020-08-16: qty 2

## 2020-08-16 MED ORDER — LACTATED RINGERS IV SOLN
500.0000 mL | Freq: Once | INTRAVENOUS | Status: AC
  Administered 2020-08-16: 500 mL via INTRAVENOUS

## 2020-08-16 MED ORDER — LIDOCAINE HCL (PF) 1 % IJ SOLN
INTRAMUSCULAR | Status: DC | PRN
  Administered 2020-08-16: 3 mL

## 2020-08-16 MED ORDER — LIDOCAINE-EPINEPHRINE (PF) 1.5 %-1:200000 IJ SOLN
INTRAMUSCULAR | Status: DC | PRN
  Administered 2020-08-16: 3 mL via PERINEURAL

## 2020-08-16 MED ORDER — FENTANYL 2.5 MCG/ML W/ROPIVACAINE 0.15% IN NS 100 ML EPIDURAL (ARMC)
12.0000 mL/h | EPIDURAL | Status: DC
  Administered 2020-08-16: 12 mL/h via EPIDURAL

## 2020-08-16 NOTE — Anesthesia Procedure Notes (Signed)
Epidural Patient location during procedure: OB Start time: 08/16/2020 12:15 PM End time: 08/16/2020 12:30 PM  Staffing Anesthesiologist: Karleen Hampshire, MD Resident/CRNA: Rosanne Gutting, CRNA Performed: anesthesiologist and resident/CRNA   Preanesthetic Checklist Completed: patient identified, IV checked, site marked, risks and benefits discussed, surgical consent, monitors and equipment checked, pre-op evaluation and timeout performed  Epidural Patient position: sitting Prep: ChloraPrep Patient monitoring: heart rate, continuous pulse ox and blood pressure Approach: midline Location: L3-L4 Injection technique: LOR saline  Needle:  Needle type: Tuohy  Needle gauge: 17 G Needle length: 9 cm and 9 Needle insertion depth: 7.5 cm Catheter type: closed end flexible Catheter size: 19 Gauge Catheter at skin depth: 12 and 12 cm Test dose: negative and 1.5% lidocaine with Epi 1:200 K  Assessment Sensory level: T10 Events: blood not aspirated, injection not painful, no injection resistance, no paresthesia and negative IV test  Additional Notes 1 attempt Pt. Evaluated and documentation done after procedure finished. Patient identified. Risks/Benefits/Options discussed with patient including but not limited to bleeding, infection, nerve damage, paralysis, failed block, incomplete pain control, headache, blood pressure changes, nausea, vomiting, reactions to medication both or allergic, itching and postpartum back pain. Confirmed with bedside nurse the patient's most recent platelet count. Confirmed with patient that they are not currently taking any anticoagulation, have any bleeding history or any family history of bleeding disorders. Patient expressed understanding and wished to proceed. All questions were answered. Sterile technique was used throughout the entire procedure. Please see nursing notes for vital signs. Test dose was given through epidural catheter and negative prior to  continuing to dose epidural or start infusion. Warning signs of high block given to the patient including shortness of breath, tingling/numbness in hands, complete motor block, or any concerning symptoms with instructions to call for help. Patient was given instructions on fall risk and not to get out of bed. All questions and concerns addressed with instructions to call with any issues or inadequate analgesia.   Patient tolerated the insertion well without immediate complications.Reason for block:procedure for pain

## 2020-08-16 NOTE — Progress Notes (Signed)
  Labor Progress Note   24 y.o. G1P0000 @ [redacted]w[redacted]d , admitted for  Pregnancy, Labor Management.   Subjective:  Feels belly tightening- no pain. She has had breakfast and a shower. We discussed plan of care.  Objective:  BP (!) 148/92 (BP Location: Right Arm)   Pulse 88   Temp 98.2 F (36.8 C) (Oral)   Resp 18   Ht 5\' 5"  (1.651 m)   Wt 110.5 kg   LMP 11/18/2019 (Exact Date)   BMI 40.54 kg/m  Abd: gravid, ND, FHT present, mild tenderness on exam Extr: trace to 1+ bilateral pedal edema SVE: CERVIX: 3.5 cm dilated, 60-70 effaced, -2 station, cervix swept  EFM: FHR: 140 bpm, variability: moderate,  accelerations:  Present,  decelerations:  Absent Toco: Frequency: Every 2-3 with occasional spacing to 5 minutes Labs: I have reviewed the patient's lab results.   Assessment & Plan:  G1P0000 @ [redacted]w[redacted]d, admitted for  Pregnancy and Labor/Delivery Management  1. Pain management: none 2. FWB: FHT category I.  3. ID: GBS negative 4. Labor management: start pitocin if contractions space consistently to every 4 minutes or greater, consider AROM when appropriate  All discussed with patient, see orders   [redacted]w[redacted]d, CNM [facility] 08/16/2020  9:42 AM

## 2020-08-16 NOTE — Discharge Summary (Addendum)
OB Discharge Summary     Patient Name: CECLIA KOKER DOB: [birth year] MRN: [id number]  Date of admission: 08/15/2020 Delivering provider: Tresea Mall, CNM  Date of Delivery: 08/16/2020  Date of discharge: 08/18/2020  Admitting diagnosis: Intrauterine growth restriction (IUGR) affecting care of mother, third trimester, single gestation [O36.5930] Intrauterine pregnancy: [redacted]w[redacted]d     Secondary diagnosis: chronic hypertension vs gestational hypertension without severe features     Discharge diagnosis: Term Pregnancy Delivered                                                                                                Post partum procedures:none  Augmentation: AROM, Pitocin and Cytotec  Complications: None  Hospital course:  Induction of Labor With Vaginal Delivery   24 y.o. yo G1P0000 at [redacted]w[redacted]d was admitted to the hospital 08/15/2020 for induction of labor.  Indication for induction: intrauterine growth restriction.  Patient had an uncomplicated labor course as follows: Membrane Rupture Time/Date: 5:05 PM ,08/16/2020   Delivery Method:Vaginal, Spontaneous  Episiotomy: None  Lacerations:  None  Details of delivery can be found in separate delivery note.    Patient had a routine postpartum course. With some elevated blood pressure readings,she was placed on labetalol 200 mg po BID at discharge.  Patient is discharged home 08/18/20.  Newborn Data: Birth date:08/16/2020  Birth time:6:37 PM  Gender:Female  Living status:Living  Apgars:8 ,9  Weight: 6 pounds 1 ounce, 2760 g   Physical exam  Vitals:   08/17/20 1606 08/17/20 2020 08/18/20 0820 08/18/20 1003  BP: 138/87 121/78 (!) 147/96 129/82  Pulse: 96 99 85 67  Resp: 18 20 20 16   Temp: 97.6 F (36.4 C) 97.8 F (36.6 C) 98.7 F (37.1 C)   TempSrc: Oral Oral Oral   SpO2: 100% 100% 100%   Weight:      Height:       General: alert, cooperative and no distress Lochia: appropriate Uterine Fundus: firm Incision: N/A DVT  Evaluation: No evidence of DVT seen on physical exam. Negative Homan's sign. No cords or calf tenderness.  Labs: Lab Results  Component Value Date   WBC 16.0 (H) 08/17/2020   HGB 9.4 (L) 08/17/2020   HCT 28.3 (L) 08/17/2020   MCV 76.7 (L) 08/17/2020   PLT 165 08/17/2020    Discharge instruction: per After Visit Summary.  Medications:  Allergies as of 08/18/2020   No Known Allergies      Diet: routine diet  Activity: Advance as tolerated. Pelvic rest for 6 weeks.   Outpatient follow up:  Follow-up Information    10/18/2020, CNM. Schedule an appointment as soon as possible for a visit in 6 week(s).   Specialty: Obstetrics Why: postpartum visit Contact information: [address] [place] [phone]                 Postpartum contraception: Mirena IUD Rhogam Given postpartum: NA Rubella vaccine given postpartum: no Varicella vaccine given postpartum: no TDaP given antepartum or postpartum: yes   Newborn Delivery   Birth date/time: 08/16/2020 18:37:00 Delivery type: Vaginal, Spontaneous  Baby Feeding: Breast/bottle  Disposition:home with mother  SIGNED:  Mirna Mires, Ina Homes 08/18/2020 10:58 AM

## 2020-08-16 NOTE — Progress Notes (Signed)
  Labor Progress Note   24 y.o. G1P0000 @ [redacted]w[redacted]d , admitted for  Pregnancy, Labor Management.   Subjective:  Very comfortable with epidural and unable to feel/move her legs. Discussed we may have to turn down epidural when it is time to push.   Objective:  BP (!) 145/82 (BP Location: Right Arm)   Pulse 89   Temp 97.8 F (36.6 C)   Resp 17   Ht 5\' 5"  (1.651 m)   Wt 110.5 kg   LMP 11/18/2019 (Exact Date)   SpO2 100%   BMI 40.54 kg/m  Abd: gravid, ND, FHT present, mild tenderness on exam Extr: trace to 1+ bilateral pedal edema SVE: CERVIX: 10 cm dilated, 100 effaced, +1 station  EFM: FHR: 130 bpm, variability: moderate,  accelerations:  Present,  decelerations:  Absent Toco: Frequency: Every 2 minutes Labs: I have reviewed the patient's lab results.   Assessment & Plan:  G1P0000 @ [redacted]w[redacted]d, admitted for  Pregnancy and Labor/Delivery Management  1. Pain management: epidural. 2. FWB: FHT category I.  3. ID: GBS negative 4. Labor management: continue pitocin, labor down  All discussed with patient, see orders   [redacted]w[redacted]d, CNM [facility] 08/16/2020  5:11 PM

## 2020-08-16 NOTE — Anesthesia Preprocedure Evaluation (Signed)
Anesthesia Evaluation  Patient identified by MRN, date of birth, ID band Patient awake    Reviewed: Allergy & Precautions, H&P , NPO status , Patient's Chart, lab work & pertinent test results  History of Anesthesia Complications Negative for: history of anesthetic complications  Airway Mallampati: II  TM Distance: >3 FB Neck ROM: full    Dental no notable dental hx.    Pulmonary neg pulmonary ROS,    Pulmonary exam normal        Cardiovascular negative cardio ROS Normal cardiovascular exam     Neuro/Psych negative neurological ROS  negative psych ROS   GI/Hepatic negative GI ROS, Neg liver ROS,   Endo/Other  negative endocrine ROS  Renal/GU negative Renal ROS  negative genitourinary   Musculoskeletal   Abdominal   Peds  Hematology negative hematology ROS (+)   Anesthesia Other Findings   Reproductive/Obstetrics (+) Pregnancy                             Anesthesia Physical Anesthesia Plan  ASA: II  Anesthesia Plan: Epidural   Post-op Pain Management:    Induction:   PONV Risk Score and Plan:   Airway Management Planned:   Additional Equipment:   Intra-op Plan:   Post-operative Plan:   Informed Consent: I have reviewed the patients History and Physical, chart, labs and discussed the procedure including the risks, benefits and alternatives for the proposed anesthesia with the patient or authorized representative who has indicated his/her understanding and acceptance.     Dental Advisory Given  Plan Discussed with: Anesthesiologist and CRNA  Anesthesia Plan Comments:         Anesthesia Quick Evaluation

## 2020-08-17 LAB — CBC
HCT: 28.3 % — ABNORMAL LOW (ref 36.0–46.0)
Hemoglobin: 9.4 g/dL — ABNORMAL LOW (ref 12.0–15.0)
MCH: 25.5 pg — ABNORMAL LOW (ref 26.0–34.0)
MCHC: 33.2 g/dL (ref 30.0–36.0)
MCV: 76.7 fL — ABNORMAL LOW (ref 80.0–100.0)
Platelets: 165 10*3/uL (ref 150–400)
RBC: 3.69 MIL/uL — ABNORMAL LOW (ref 3.87–5.11)
RDW: 15.6 % — ABNORMAL HIGH (ref 11.5–15.5)
WBC: 16 10*3/uL — ABNORMAL HIGH (ref 4.0–10.5)
nRBC: 0 % (ref 0.0–0.2)

## 2020-08-17 MED ORDER — ACETAMINOPHEN 325 MG PO TABS: 650.0000 mg | ORAL_TABLET | ORAL | Status: DC | PRN

## 2020-08-17 MED ORDER — WITCH HAZEL-GLYCERIN EX PADS: 1.0000 "application " | MEDICATED_PAD | CUTANEOUS | Status: DC | PRN

## 2020-08-17 MED ORDER — VARICELLA VIRUS VACCINE LIVE 1350 PFU/0.5ML IJ SUSR
0.5000 mL | Freq: Once | INTRAMUSCULAR | Status: DC
  Filled 2020-08-17: qty 0.5

## 2020-08-17 MED ORDER — TETANUS-DIPHTH-ACELL PERTUSSIS 5-2.5-18.5 LF-MCG/0.5 IM SUSP: 0.5000 mL | Freq: Once | INTRAMUSCULAR | Status: DC

## 2020-08-17 MED ORDER — BENZOCAINE-MENTHOL 20-0.5 % EX AERO: 1.0000 "application " | INHALATION_SPRAY | CUTANEOUS | Status: DC | PRN

## 2020-08-17 MED ORDER — COCONUT OIL OIL
1.0000 "application " | TOPICAL_OIL | Status: DC | PRN
  Administered 2020-08-17: 1 via TOPICAL
  Filled 2020-08-17: qty 120

## 2020-08-17 MED ORDER — DIBUCAINE (PERIANAL) 1 % EX OINT: 1.0000 "application " | TOPICAL_OINTMENT | CUTANEOUS | Status: DC | PRN

## 2020-08-17 MED ORDER — PRENATAL MULTIVITAMIN CH
1.0000 | ORAL_TABLET | Freq: Every day | ORAL | Status: DC
  Administered 2020-08-17 – 2020-08-18 (×2): 1 via ORAL
  Filled 2020-08-17 (×2): qty 1

## 2020-08-17 MED ORDER — ONDANSETRON HCL 4 MG PO TABS: 4.0000 mg | ORAL_TABLET | ORAL | Status: DC | PRN

## 2020-08-17 MED ORDER — ONDANSETRON HCL 4 MG/2ML IJ SOLN: 4.0000 mg | INTRAMUSCULAR | Status: DC | PRN

## 2020-08-17 MED ORDER — SIMETHICONE 80 MG PO CHEW: 80.0000 mg | CHEWABLE_TABLET | ORAL | Status: DC | PRN

## 2020-08-17 MED ORDER — SENNOSIDES-DOCUSATE SODIUM 8.6-50 MG PO TABS
2.0000 | ORAL_TABLET | ORAL | Status: DC
  Administered 2020-08-17 – 2020-08-18 (×2): 2 via ORAL
  Filled 2020-08-17 (×2): qty 2

## 2020-08-17 MED ORDER — IBUPROFEN 600 MG PO TABS
600.0000 mg | ORAL_TABLET | Freq: Four times a day (QID) | ORAL | Status: DC
  Administered 2020-08-17 – 2020-08-18 (×4): 600 mg via ORAL
  Filled 2020-08-17 (×5): qty 1

## 2020-08-17 MED ORDER — DIPHENHYDRAMINE HCL 25 MG PO CAPS: 25.0000 mg | ORAL_CAPSULE | Freq: Four times a day (QID) | ORAL | Status: DC | PRN

## 2020-08-17 NOTE — Progress Notes (Signed)
Post Partum Day 1 Subjective: no complaints, up ad lib, voiding, tolerating PO and and her bleeding is scant. she intends to both breast and bottle feed, but has been primarily giving the baby formula.  Objective: Blood pressure (!) 143/93, pulse 67, temperature 98.1 F (36.7 C), temperature source Oral, resp. rate 20, height 5\' 5"  (1.651 m), weight 110.5 kg, last menstrual period 11/18/2019, SpO2 100 %, unknown if currently breastfeeding.  Physical Exam:  General: alert, cooperative and no distress Lochia: appropriate Uterine Fundus: firm Incision: intact perineum. DVT Evaluation: No evidence of DVT seen on physical exam. Negative Homan's sign. No cords or calf tenderness.  Recent Labs    08/16/20 2125 08/17/20 0721  HGB 10.9* 9.4*  HCT 33.9* 28.3*    Assessment/Plan: Discharge home likely later today. Follow up planned for 6 weeks at [facility]. Needs a birth control plan prior to discharge.    LOS: 2 days   HOSP DEL MAESTRO 08/17/2020, 10:38 AM

## 2020-08-17 NOTE — Lactation Note (Addendum)
This note was copied from a baby's chart. Lactation Consultation Note  Patient Name: Boy Diera Wirkkala [id number] Date: 08/17/2020 Reason for consult: Follow-up assessment (wanting to start pumping)  LC to the room to assist with feed and set the breast pump up. Mother stated baby had just fed 80ml's of formula. LC encouraged her to attempt to latch prior to bottle feed. Since baby was alert and content LC asked if we could try to latch baby to review positioning. [facility] taught Mother how to hold baby in cross cradle hold and stroke the nipple from his nose to chin. She had previously been attempting in cradle and stated it hurts when he latches. Baby opens his mouth and latches but is not suckling. LC encouraged skin to skin. LC set up Symphony pump in the room, reviewed setup, cleaning, and labeling of supplies and milk storage. Encouraged Mother to pump 8 times in 24 hours and for baby to feed a minimum of 8 times in 24 hours once he is older than 24 HOL. LC # placed on board and encouraged to call prior to next bottle feed.  Maternal Data Formula Feeding for Exclusion: No Has patient been taught Hand Expression?: Yes Does the patient have breastfeeding experience prior to this delivery?: No  Feeding Feeding Type: Breast Fed (attempted at breast, no latch) Nipple Type: Slow - flow  LATCH Score Latch: Too sleepy or reluctant, no latch achieved, no sucking elicited.  Audible Swallowing: None  Type of Nipple: Everted at rest and after stimulation  Comfort (Breast/Nipple): Soft / non-tender  Hold (Positioning): Assistance needed to correctly position infant at breast and maintain latch.  LATCH Score: 5  Interventions Interventions: Breast feeding basics reviewed;Assisted with latch;Adjust position;Position options  Lactation Tools Discussed/Used WIC Program: Yes Pump Review: Setup, frequency, and cleaning;Milk Storage;Other (comment) (labels) Initiated by:: Lactation/ Germain Koopmann Date initiated::  08/17/20   Consult Status Consult Status: Follow-up Date: 08/18/20 Follow-up type: Call as needed    Ashle Stief D Sunshine Mackowski 08/17/2020, 2:59 PM

## 2020-08-18 MED ORDER — IBUPROFEN 600 MG PO TABS: 600.0000 mg | ORAL_TABLET | Freq: Four times a day (QID) | ORAL | 0 refills | Status: DC

## 2020-08-18 MED ORDER — LABETALOL HCL 200 MG PO TABS
200.0000 mg | ORAL_TABLET | Freq: Two times a day (BID) | ORAL | Status: DC
  Administered 2020-08-18: 200 mg via ORAL
  Filled 2020-08-18: qty 2

## 2020-08-18 MED ORDER — LABETALOL HCL 200 MG PO TABS
200.0000 mg | ORAL_TABLET | Freq: Two times a day (BID) | ORAL | 0 refills | Status: DC
Start: 2020-08-18 — End: 2020-11-25

## 2020-08-19 NOTE — Anesthesia Postprocedure Evaluation (Signed)
Anesthesia Post Note  Patient: Doris Thomas  Procedure(s) Performed: AN AD HOC LABOR EPIDURAL  Anesthesia Type: Epidural   No complications documented.   Last Vitals: There were no vitals filed for this visit.  Last Pain: There were no vitals filed for this visit.      This pt was not seen by the on-call weekend anesthesia provider.  No complications with epidural per chart review and nursing notes.    Karleen Hampshire

## 2020-08-20 LAB — SURGICAL PATHOLOGY

## 2020-08-24 ENCOUNTER — Inpatient Hospital Stay: Admit: 2020-08-24 | Payer: Self-pay

## 2020-08-27 ENCOUNTER — Other Ambulatory Visit: Payer: Self-pay

## 2020-08-27 ENCOUNTER — Encounter: Payer: Self-pay | Admitting: Emergency Medicine

## 2020-08-27 ENCOUNTER — Emergency Department: Payer: Medicaid Other

## 2020-08-27 ENCOUNTER — Ambulatory Visit
Admission: EM | Admit: 2020-08-27 | Discharge: 2020-08-27 | Discharge status: Against Medical Advice | Payer: Medicaid Other

## 2020-08-27 ENCOUNTER — Emergency Department
Admission: EM | Admit: 2020-08-27 | Discharge: 2020-08-27 | Disposition: A | Discharge status: Home / Self Care | Payer: Medicaid Other | Attending: Emergency Medicine | Admitting: Emergency Medicine

## 2020-08-27 DIAGNOSIS — R0602 Shortness of breath: Secondary | ICD-10-CM | POA: Diagnosis not present

## 2020-08-27 DIAGNOSIS — Z5321 Procedure and treatment not carried out due to patient leaving prior to being seen by health care provider: Secondary | ICD-10-CM | POA: Diagnosis not present

## 2020-08-27 DIAGNOSIS — B974 Respiratory syncytial virus as the cause of diseases classified elsewhere: Secondary | ICD-10-CM | POA: Insufficient documentation

## 2020-08-27 DIAGNOSIS — Z20822 Contact with and (suspected) exposure to covid-19: Secondary | ICD-10-CM | POA: Diagnosis not present

## 2020-08-27 DIAGNOSIS — R05 Cough: Secondary | ICD-10-CM | POA: Diagnosis not present

## 2020-08-27 DIAGNOSIS — R509 Fever, unspecified: Secondary | ICD-10-CM | POA: Diagnosis not present

## 2020-08-27 LAB — CBC
HCT: 36 % (ref 36.0–46.0)
Hemoglobin: 10.8 g/dL — ABNORMAL LOW (ref 12.0–15.0)
MCH: 24.7 pg — ABNORMAL LOW (ref 26.0–34.0)
MCHC: 30 g/dL (ref 30.0–36.0)
MCV: 82.2 fL (ref 80.0–100.0)
Platelets: 367 10*3/uL (ref 150–400)
RBC: 4.38 MIL/uL (ref 3.87–5.11)
RDW: 16.2 % — ABNORMAL HIGH (ref 11.5–15.5)
WBC: 13.7 10*3/uL — ABNORMAL HIGH (ref 4.0–10.5)
nRBC: 0 % (ref 0.0–0.2)

## 2020-08-27 LAB — BASIC METABOLIC PANEL
Anion gap: 11 (ref 5–15)
BUN: 7 mg/dL (ref 6–20)
CO2: 24 mmol/L (ref 22–32)
Calcium: 9 mg/dL (ref 8.9–10.3)
Chloride: 103 mmol/L (ref 98–111)
Creatinine, Ser: 0.7 mg/dL (ref 0.44–1.00)
GFR calc Af Amer: >60 mL/min (ref >60)
GFR calc non Af Amer: >60 mL/min (ref >60)
Glucose, Bld: 104 mg/dL — ABNORMAL HIGH (ref 70–99)
Potassium: 3.6 mmol/L (ref 3.5–5.1)
Sodium: 138 mmol/L (ref 135–145)

## 2020-08-27 LAB — TROPONIN I (HIGH SENSITIVITY): Troponin I (High Sensitivity): 4 ng/L (ref <18)

## 2020-08-27 LAB — RESP PANEL BY RT PCR (RSV, FLU A&B, COVID)
Influenza A by PCR: NEGATIVE
Influenza B by PCR: NEGATIVE
Respiratory Syncytial Virus by PCR: NEGATIVE
SARS Coronavirus 2 by RT PCR: NEGATIVE

## 2020-08-27 NOTE — ED Triage Notes (Signed)
Pt post partum 11 days with new born at home, who is RSV + as of 9/19. Pt to ED due to cough, fever and SOB x1 day. Pt last took 1g Tylenol at 1845.

## 2020-08-28 ENCOUNTER — Emergency Department: Payer: Medicaid Other

## 2020-08-28 ENCOUNTER — Observation Stay
Admission: EM | Admit: 2020-08-28 | Discharge: 2020-08-29 | Disposition: A | Discharge status: Home / Self Care | Payer: Medicaid Other | Attending: Family Medicine | Admitting: Family Medicine

## 2020-08-28 ENCOUNTER — Other Ambulatory Visit: Payer: Self-pay

## 2020-08-28 ENCOUNTER — Encounter: Payer: Self-pay | Admitting: *Deleted

## 2020-08-28 ENCOUNTER — Other Ambulatory Visit
Admission: RE | Admit: 2020-08-28 | Discharge: 2020-08-28 | Disposition: A | Discharge status: Home / Self Care | Payer: Medicaid Other | Source: Ambulatory Visit | Attending: Family Medicine | Admitting: Family Medicine

## 2020-08-28 DIAGNOSIS — M549 Dorsalgia, unspecified: Secondary | ICD-10-CM

## 2020-08-28 DIAGNOSIS — R0602 Shortness of breath: Secondary | ICD-10-CM

## 2020-08-28 DIAGNOSIS — R1012 Left upper quadrant pain: Secondary | ICD-10-CM | POA: Diagnosis not present

## 2020-08-28 DIAGNOSIS — R0600 Dyspnea, unspecified: Secondary | ICD-10-CM | POA: Diagnosis not present

## 2020-08-28 DIAGNOSIS — I2699 Other pulmonary embolism without acute cor pulmonale: Secondary | ICD-10-CM | POA: Diagnosis not present

## 2020-08-28 DIAGNOSIS — D649 Anemia, unspecified: Secondary | ICD-10-CM | POA: Diagnosis not present

## 2020-08-28 DIAGNOSIS — R079 Chest pain, unspecified: Secondary | ICD-10-CM | POA: Diagnosis present

## 2020-08-28 DIAGNOSIS — R000 Tachycardia, unspecified: Secondary | ICD-10-CM | POA: Diagnosis not present

## 2020-08-28 DIAGNOSIS — N390 Urinary tract infection, site not specified: Secondary | ICD-10-CM | POA: Diagnosis not present

## 2020-08-28 DIAGNOSIS — R1032 Left lower quadrant pain: Secondary | ICD-10-CM | POA: Diagnosis not present

## 2020-08-28 DIAGNOSIS — R030 Elevated blood-pressure reading, without diagnosis of hypertension: Secondary | ICD-10-CM

## 2020-08-28 DIAGNOSIS — R109 Unspecified abdominal pain: Secondary | ICD-10-CM | POA: Insufficient documentation

## 2020-08-28 LAB — FIBRIN DERIVATIVES D-DIMER (ARMC ONLY): Fibrin derivatives D-dimer (ARMC): 1355.58 ng/mL (FEU) — ABNORMAL HIGH (ref 0.00–499.00)

## 2020-08-28 MED ORDER — ENOXAPARIN SODIUM 120 MG/0.8ML ~~LOC~~ SOLN
110.0000 mg | Freq: Once | SUBCUTANEOUS | Status: AC
  Administered 2020-08-29: 110 mg via SUBCUTANEOUS
  Filled 2020-08-28: qty 0.8

## 2020-08-28 MED ORDER — IOHEXOL 350 MG/ML SOLN
100.0000 mL | Freq: Once | INTRAVENOUS | Status: AC | PRN
  Administered 2020-08-28: 100 mL via INTRAVENOUS

## 2020-08-28 NOTE — ED Notes (Signed)
No repeat of lab at this time per dr Lenard Lance

## 2020-08-28 NOTE — ED Triage Notes (Signed)
Pt is post partum 12 days.  Pt reports she was in er last night but left without being seen.  Pt went to [facility] today and was told to come to er for eval of blood clots.  Pt has back pain and is concerned about having a PE.  Pt alert  Speech clear.  No chest pain or sob.

## 2020-08-28 NOTE — ED Provider Notes (Signed)
[facility] Emergency Department Provider Note  ____________________________________________   I have reviewed the triage vital signs and the nursing notes.   HISTORY  Chief Complaint Back Pain   History limited by: Not Limited   HPI Doris Thomas is a 24 y.o. female who presents to the emergency department today because of concerns for back pain.  Located in her upper back.  She states the pain is been going on for the past 2 days.  It started suddenly around 2 AM.  She states that the pain does make it hard for her to get a full breath.  She has had some cough.  Patient came to the emergency department yesterday for this however left prior to being evaluated by physician.  When she went to [place] [facility] for continued symptoms they referred her to the emergency department because of concerns for possible blood clot.  Patient denies any history of blood clot.  States she did have some bilateral lower extremity swelling after pregnancy.   Records reviewed. Per medical record review patient has a history of recent vaginal delivery.   Past Medical History:  Diagnosis Date  . Medical history non-contributory   . Vitamin D deficiency     Patient Active Problem List   Diagnosis Date Noted  . Encounter for care or examination of lactating mother 08/16/2020  . Single liveborn infant delivered vaginally   . Postpartum care following vaginal delivery   . Intrauterine growth restriction affecting antepartum care of mother in third trimester, not applicable or unspecified fetus 08/15/2020  . [redacted] weeks gestation of pregnancy 08/15/2020  . Intrauterine growth restriction (IUGR) affecting care of mother, third trimester, single gestation 08/15/2020  . Swelling of lower extremity during pregnancy in third trimester 08/03/2020  . [redacted] weeks gestation of pregnancy   . Supervision of high risk pregnancy, antepartum 01/04/2020  . Family history of osteogenesis imperfecta  01/04/2020  . 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 Vitamin D deficiency 10/18/2017  . Encounter for contraceptive management 10/18/2017    Past Surgical History:  Procedure Laterality Date  . NO PAST SURGERIES      Prior to Admission medications   Medication Sig Start Date End Date Taking? Authorizing Provider  acetaminophen (TYLENOL) 325 MG tablet Take 650 mg by mouth every 6 (six) hours as needed.    [provider]  clindamycin (CLEOCIN-T) 1 % external solution Apply topically 2 (two) times daily. 07/24/20   Schuman, Jaquelyn Bitter, MD  ibuprofen (ADVIL) 600 MG tablet Take 1 tablet (600 mg total) by mouth every 6 (six) hours. 08/18/20   Mirna Mires, CNM  labetalol (NORMODYNE) 200 MG tablet Take 1 tablet (200 mg total) by mouth 2 (two) times daily. 08/18/20   Mirna Mires, CNM  Prenat-FeFum-FePo-FA-Omega 3 (CONCEPT DHA) 53.5-38-1 MG CAPS  01/08/20   [provider]    Allergies Patient has no known allergies.  Family History  Problem Relation Age of Onset  . Osteogenesis imperfecta Father        6 of patient's siblings also have osteogenesis imperfecta  . Cancer Maternal Grandmother   . Diabetes Paternal Grandmother   . Osteogenesis imperfecta Niece        sister's daughter  . Osteogenesis imperfecta Nephew        brother's son    Social History Social History   Tobacco Use  . Smoking status: Never Smoker  . Smokeless tobacco: Never Used  Vaping Use  . Vaping Use:  Never used  Substance Use Topics  . Alcohol use: No  . Drug use: No    Review of Systems Constitutional: No fever/chills Eyes: No visual changes. ENT: No sore throat. Cardiovascular: Denies chest pain. Respiratory: Positive for shortness of breath. Gastrointestinal: No abdominal pain.  No nausea, no vomiting.  No diarrhea.   Genitourinary: Negative for dysuria. Musculoskeletal: Positive for back pain. Skin: Negative for rash. Neurological: Negative for  headaches, focal weakness or numbness.  ____________________________________________   PHYSICAL EXAM:  VITAL SIGNS: ED Triage Vitals  Enc Vitals Group     BP 08/28/20 1632 (!) 149/97     Pulse Rate 08/28/20 1632 (!) 111     Resp 08/28/20 1632 20     Temp 08/28/20 1632 98.5 F (36.9 C)     Temp Source 08/28/20 1632 Oral     SpO2 08/28/20 1632 99 %     Weight 08/28/20 1633 240 lb (108.9 kg)     Height 08/28/20 1633 5\' 5"  (1.651 m)     Head Circumference --      Peak Flow --      Pain Score 08/28/20 1633 6   Constitutional: Alert and oriented.  Eyes: Conjunctivae are normal.  ENT      Head: Normocephalic and atraumatic.      Nose: No congestion/rhinnorhea.      Mouth/Throat: Mucous membranes are moist.      Neck: No stridor. Hematological/Lymphatic/Immunilogical: No cervical lymphadenopathy. Cardiovascular: Tachycardic, regular rhythm.  No murmurs, rubs, or gallops.  Respiratory: Normal respiratory effort without tachypnea nor retractions. Breath sounds are clear and equal bilaterally. No wheezes/rales/rhonchi. Gastrointestinal: Soft and non tender. No rebound. No guarding.  Genitourinary: Deferred Musculoskeletal: Normal range of motion in all extremities. No lower extremity edema. Neurologic:  Normal speech and language. No gross focal neurologic deficits are appreciated.  Skin:  Skin is warm, dry and intact. No rash noted. Psychiatric: Mood and affect are normal. Speech and behavior are normal. Patient exhibits appropriate insight and judgment.  ____________________________________________    LABS (pertinent positives/negatives)  Labs from yesterday reviewed  ____________________________________________   EKG  None  ____________________________________________    RADIOLOGY  CT angio pending  ____________________________________________   PROCEDURES  Procedures  ____________________________________________   INITIAL IMPRESSION / ASSESSMENT AND  PLAN / ED COURSE  Pertinent labs & imaging results that were available during my care of the patient were reviewed by me and considered in my medical decision making (see chart for details).   Patient presented to the emergency department today because of continued back pain with some shortness of breath.  Blood work performed yesterday did show elevated D-dimer.  While this certainly could be elevated simply in the setting of recent vaginal delivery will get CT angio to evaluate for PE.  Chest x-ray from yesterday showed some bilateral basilar atelectasis however no other concerning findings.  ____________________________________________   FINAL CLINICAL IMPRESSION(S) / ED DIAGNOSES  Final diagnoses:  Acute back pain, unspecified back location, unspecified back pain laterality  Shortness of breath     Note: This dictation was prepared with Dragon dictation. Any transcriptional errors that result from this process are unintentional     08/30/20, MD 08/28/20 2258

## 2020-08-28 NOTE — ED Notes (Signed)
Pt transported to CT ?

## 2020-08-29 ENCOUNTER — Observation Stay: Payer: Medicaid Other

## 2020-08-29 ENCOUNTER — Other Ambulatory Visit: Payer: Self-pay

## 2020-08-29 DIAGNOSIS — R030 Elevated blood-pressure reading, without diagnosis of hypertension: Secondary | ICD-10-CM

## 2020-08-29 DIAGNOSIS — R0602 Shortness of breath: Secondary | ICD-10-CM | POA: Insufficient documentation

## 2020-08-29 DIAGNOSIS — I2693 Single subsegmental pulmonary embolism without acute cor pulmonale: Secondary | ICD-10-CM | POA: Diagnosis not present

## 2020-08-29 DIAGNOSIS — D649 Anemia, unspecified: Secondary | ICD-10-CM | POA: Diagnosis not present

## 2020-08-29 DIAGNOSIS — M549 Dorsalgia, unspecified: Secondary | ICD-10-CM

## 2020-08-29 DIAGNOSIS — I2699 Other pulmonary embolism without acute cor pulmonale: Secondary | ICD-10-CM | POA: Diagnosis present

## 2020-08-29 LAB — BASIC METABOLIC PANEL
Anion gap: 10 (ref 5–15)
BUN: 9 mg/dL (ref 6–20)
CO2: 22 mmol/L (ref 22–32)
Calcium: 8.8 mg/dL — ABNORMAL LOW (ref 8.9–10.3)
Chloride: 103 mmol/L (ref 98–111)
Creatinine, Ser: 0.75 mg/dL (ref 0.44–1.00)
GFR calc Af Amer: >60 mL/min (ref >60)
GFR calc non Af Amer: >60 mL/min (ref >60)
Glucose, Bld: 93 mg/dL (ref 70–99)
Potassium: 3.4 mmol/L — ABNORMAL LOW (ref 3.5–5.1)
Sodium: 135 mmol/L (ref 135–145)

## 2020-08-29 LAB — COMPREHENSIVE METABOLIC PANEL
ALT: 14 U/L (ref 0–44)
AST: 15 U/L (ref 15–41)
Albumin: 3.4 g/dL — ABNORMAL LOW (ref 3.5–5.0)
Alkaline Phosphatase: 95 U/L (ref 38–126)
Anion gap: 10 (ref 5–15)
BUN: 9 mg/dL (ref 6–20)
CO2: 25 mmol/L (ref 22–32)
Calcium: 9 mg/dL (ref 8.9–10.3)
Chloride: 102 mmol/L (ref 98–111)
Creatinine, Ser: 0.72 mg/dL (ref 0.44–1.00)
GFR calc Af Amer: >60 mL/min (ref >60)
GFR calc non Af Amer: >60 mL/min (ref >60)
Glucose, Bld: 94 mg/dL (ref 70–99)
Potassium: 3.5 mmol/L (ref 3.5–5.1)
Sodium: 137 mmol/L (ref 135–145)
Total Bilirubin: 0.8 mg/dL (ref 0.3–1.2)
Total Protein: 8 g/dL (ref 6.5–8.1)

## 2020-08-29 LAB — VITAMIN B12: Vitamin B-12: 281 pg/mL (ref 180–914)

## 2020-08-29 LAB — CBC
HCT: 31.8 % — ABNORMAL LOW (ref 36.0–46.0)
HCT: 34 % — ABNORMAL LOW (ref 36.0–46.0)
Hemoglobin: 10.2 g/dL — ABNORMAL LOW (ref 12.0–15.0)
Hemoglobin: 10.4 g/dL — ABNORMAL LOW (ref 12.0–15.0)
MCH: 25 pg — ABNORMAL LOW (ref 26.0–34.0)
MCH: 25.2 pg — ABNORMAL LOW (ref 26.0–34.0)
MCHC: 30.6 g/dL (ref 30.0–36.0)
MCHC: 32.1 g/dL (ref 30.0–36.0)
MCV: 78.5 fL — ABNORMAL LOW (ref 80.0–100.0)
MCV: 81.7 fL (ref 80.0–100.0)
Platelets: 332 10*3/uL (ref 150–400)
Platelets: 339 10*3/uL (ref 150–400)
RBC: 4.05 MIL/uL (ref 3.87–5.11)
RBC: 4.16 MIL/uL (ref 3.87–5.11)
RDW: 16.3 % — ABNORMAL HIGH (ref 11.5–15.5)
RDW: 16.3 % — ABNORMAL HIGH (ref 11.5–15.5)
WBC: 10.3 10*3/uL (ref 4.0–10.5)
WBC: 10.4 10*3/uL (ref 4.0–10.5)
nRBC: 0 % (ref 0.0–0.2)
nRBC: 0 % (ref 0.0–0.2)

## 2020-08-29 LAB — HIV ANTIBODY (ROUTINE TESTING W REFLEX): HIV Screen 4th Generation wRfx: NONREACTIVE

## 2020-08-29 LAB — IRON AND TIBC
Iron: 22 ug/dL — ABNORMAL LOW (ref 28–170)
Saturation Ratios: 6 % — ABNORMAL LOW (ref 10.4–31.8)
TIBC: 379 ug/dL (ref 250–450)
UIBC: 357 ug/dL

## 2020-08-29 LAB — FOLATE: Folate: 29 ng/mL (ref >5.9)

## 2020-08-29 MED ORDER — LABETALOL HCL 200 MG PO TABS
200.0000 mg | ORAL_TABLET | Freq: Two times a day (BID) | ORAL | Status: DC
  Administered 2020-08-29: 200 mg via ORAL
  Filled 2020-08-29 (×3): qty 1

## 2020-08-29 MED ORDER — SODIUM CHLORIDE 0.9 % IV SOLN
510.0000 mg | Freq: Once | INTRAVENOUS | Status: AC
  Administered 2020-08-29: 510 mg via INTRAVENOUS
  Filled 2020-08-29: qty 17

## 2020-08-29 MED ORDER — ACETAMINOPHEN 500 MG PO TABS
ORAL_TABLET | ORAL | Status: AC
  Administered 2020-08-29: 1000 mg
  Filled 2020-08-29: qty 2

## 2020-08-29 MED ORDER — RIVAROXABAN 15 MG PO TABS
15.0000 mg | ORAL_TABLET | Freq: Two times a day (BID) | ORAL | Status: DC
  Administered 2020-08-29: 15 mg via ORAL
  Filled 2020-08-29: qty 1

## 2020-08-29 MED ORDER — ENOXAPARIN SODIUM 120 MG/0.8ML ~~LOC~~ SOLN
110.0000 mg | Freq: Two times a day (BID) | SUBCUTANEOUS | Status: DC
  Filled 2020-08-29 (×2): qty 0.8

## 2020-08-29 MED ORDER — RIVAROXABAN (XARELTO) VTE STARTER PACK (15 & 20 MG): ORAL_TABLET | ORAL | 0 refills | Status: DC

## 2020-08-29 MED ORDER — RIVAROXABAN 20 MG PO TABS: 20.0000 mg | ORAL_TABLET | Freq: Every day | ORAL | Status: DC

## 2020-08-29 MED ORDER — ACETAMINOPHEN 500 MG PO TABS
1000.0000 mg | ORAL_TABLET | Freq: Four times a day (QID) | ORAL | Status: DC | PRN
  Administered 2020-08-29: 1000 mg via ORAL

## 2020-08-29 MED ORDER — FERROUS SULFATE 325 (65 FE) MG PO TBEC: 325.0000 mg | DELAYED_RELEASE_TABLET | Freq: Two times a day (BID) | ORAL | 3 refills | Status: DC

## 2020-08-29 NOTE — Discharge Summary (Signed)
Physician Discharge Summary  Doris Thomas GNF:[id number] DOB: Apr 18, 1996 DOA: 08/28/2020  PCP: Particia Nearing, PA-C  Admit date: 08/28/2020 Discharge date: 08/29/2020  Admitted From: Home Disposition: Home  Recommendations for Outpatient Follow-up:  1. Follow up with PCP in 1-2 weeks 2. Follow-up with obs/ GYN 3. Please obtain BMP/CBC in one week 4. Please follow up on the following pending results: None  Home Health: No Equipment/Devices: None  discharge Condition: Stable CODE STATUS: Full Diet recommendation: Heart Healthy   Brief/Interim Summary: Doris Thomas is a 24 y.o. female with medical history significant for recent pregnancy with delivery on 08/16/2020 who presented with acute onset of left-sided chest pain and back pain.  Symptoms started acutely about 2 days ago she began to notice up and down left-sided chest pain as well as back pain.  It was especially worse with laying down.  She also noticed some shortness of breath with dyspnea.  She states that her blood pressure were somewhat elevated during her recent pregnancy but was never diagnosed with gestational hypertension or preeclampsia.  She was later started on labetalol after some elevated blood pressure reading postpartum.  Denies any other complications during her pregnancy or her vaginal delivery. The only thing is that for the past 2 months she has noticed bilateral lower extremity swelling worse on the right and that improved 2 days ago before the start of her current symptoms. CTA obtained in ED came back positive for left-sided PE.  Lower extremity venous Doppler was negative for DVT.  Patient was saturating well on room air.  She was initially started on therapeutic Lovenox and then transition to Xarelto as she is not breast-feeding.  Patient will need Xarelto for at least 90-month and will follow up with her primary care provider for further recommendations.  Patient had anemia, anemia panel with iron  deficiency, she had a recent delivery and is still having vaginal bleeding.  She was provided with one-time dose of IV iron and was also given provided with outpatient iron supplement. We discussed the risk of worsening of her bleeding with Xarelto and asked to seek medical attention if she started bleeding a lot.  Patient will continue with labetalol and follow-up with her gynecologist or primary care provider for further recommendations.  Discharge Diagnoses:  Principal Problem:   Pulmonary embolism (HCC) Active Problems:   Elevated blood pressure reading   Anemia  Discharge Instructions  Discharge Instructions    Diet - low sodium heart healthy   Complete by: As directed    Discharge instructions   Complete by: As directed    It was pleasure taking care of you. You are being started on a blood thinner called Xarelto for your clots in your lungs.  You will take 15 mg twice daily for 3 weeks and then start taking 20 mg daily.  You will need blood thinners for at least 48-month.  Please follow-up with your primary care provider for more refills and recommendations. As we discussed it can increase bleeding, please discuss with your gynecologist for any recommendations in order to help with increased vaginal bleeding.  You are also being given iron supplement please continue taking as directed.   Increase activity slowly   Complete by: As directed      Allergies as of 08/29/2020   No Known Allergies     Medication List    STOP taking these medications   cefdinir 300 MG capsule Commonly known as: OMNICEF     TAKE  these medications   acetaminophen 325 MG tablet Commonly known as: TYLENOL Take 650 mg by mouth every 6 (six) hours as needed.   clindamycin 1 % external solution Commonly known as: Cleocin-T Apply topically 2 (two) times daily.   ferrous sulfate 325 (65 FE) MG EC tablet Take 1 tablet (325 mg total) by mouth 2 (two) times daily.   ibuprofen 600 MG tablet Commonly  known as: ADVIL Take 1 tablet (600 mg total) by mouth every 6 (six) hours.   labetalol 200 MG tablet Commonly known as: NORMODYNE Take 1 tablet (200 mg total) by mouth 2 (two) times daily.   Rivaroxaban Stater Pack (15 mg and 20 mg) Commonly known as: XARELTO STARTER PACK Follow package directions: Take one  tablet by mouth twice a day. On day 22, switch to one  tablet once a day. Take with food.       Follow-up Information    Particia Nearing, PA-C. Schedule an appointment as soon as possible for a visit.   Specialty: Family Medicine Contact information: [address] [place] [address] [phone]              No Known Allergies  Consultations:  None  Procedures/Studies: DG Chest 2 View  Result Date: 08/27/2020 CLINICAL DATA:  Shortness of breath EXAM: CHEST - 2 VIEW COMPARISON:  None FINDINGS: Cardiac shadow is within normal limits. The lungs are well aerated bilaterally. Mild bibasilar atelectatic changes are seen. No focal infiltrate is noted. No sizable effusion is seen. No bony abnormality is noted. IMPRESSION: Mild bibasilar atelectasis. Electronically Signed   By: Alcide Clever M.D.   On: 08/27/2020 21:47   CT Angio Chest PE W and/or Wo Contrast  Result Date: 08/28/2020 CLINICAL DATA:  Back pain, dyspnea EXAM: CT ANGIOGRAPHY CHEST WITH CONTRAST TECHNIQUE: Multidetector CT imaging of the chest was performed using the standard protocol during bolus administration of intravenous contrast. Multiplanar CT image reconstructions and MIPs were obtained to evaluate the vascular anatomy. CONTRAST:  OMNIPAQUE IOHEXOL 350 MG/ML SOLN COMPARISON:  None. FINDINGS: Cardiovascular: There is adequate opacification of the pulmonary arterial tree. There is a a intraluminal filling defect seen within the posterobasal segmental pulmonary artery of the left lower lobe in keeping with acute pulmonary embolism. The embolic burden is small. There is no CT evidence of right  heart strain. The central pulmonary arteries are of normal caliber. No significant coronary artery calcification. Global cardiac size within normal limits. No pericardial effusion. Thoracic aorta is normal. Mediastinum/Nodes: No enlarged mediastinal, hilar, or axillary lymph nodes. Thyroid gland, trachea, and esophagus demonstrate no significant findings. Lungs/Pleura: There is focal consolidation within the posterior basal left lower lobe compatible with a a developing pulmonary infarct. Trace left pleural effusion is present. The lungs are otherwise clear. No pneumothorax. Central airways are widely patent. Upper Abdomen: No acute abnormality. Musculoskeletal: No chest wall abnormality. No acute or significant osseous findings. Review of the MIP images confirms the above findings. IMPRESSION: Acute pulmonary embolism involving the left lower lobe with developing pulmonary infarct and associated trace pleural effusion. Embolic burden is small. No CT evidence of right heart strain. Electronically Signed   By: Helyn Numbers MD   On: 08/28/2020 23:37   US OB Limited  Result Date: 08/01/2020 Patient Name: Doris Thomas DOB: [birth year] MRN: [id number] ULTRASOUND REPORT Location: [facility] Date of Service: 08/01/2020 Indications:AFI Findings: Mason Jim intrauterine pregnancy is visualized with FHR at 144 BPM. Fetal presentation is Cephalic. Placenta: anterior. Grade:  2 AFI: 12.4 cm Normal appearing left kidney, bladder and stomach. The right kidney is not visualized today. Impression: 1. [redacted]w[redacted]d Viable Singleton Intrauterine pregnancy dated by previously established criteria. 2. AFI is 12.4 cm. 3. The right kidney is not visible. Recommendations: 1.Clinical correlation with the patient's History and Physical Exam. Deanna Artis, RT Review of ULTRASOUND.    I have personally reviewed images and report of recent ultrasound done at [facility].    Plan of management to be discussed with patient. Annamarie Major, MD,  [facility], [facility] 08/01/2020  2:58 PM  US OB Follow Up  Result Date: 08/28/2020 Patient Name: Doris Thomas DOB: [birth year] MRN: [id number] ULTRASOUND REPORT Location: [facility] Date of Service: 08/15/2020 Indications:growth/afi Findings: Mason Jim intrauterine pregnancy is visualized with FHR at 156 BPM. Biometrics give an (U/S) Gestational age of [redacted]w[redacted]d and an (U/S) EDD of 09/03/2020; this correlates with the clinically established Estimated Date of Delivery: 08/24/20. Fetal presentation is Cephalic. Placenta: anterior. Grade: 2 AFI: 8.1 cm Growth percentile is 22.0.  AC percentile is <2.3. EFW: 2977 g  ( 6 lb 9 oz ) No right kidney is seen in the right renal fossa today. Normal appearing left kidney, stomach and bladder. BPP Scoring: Movement: 2/2 Tone: 2/2 Breathing: 2/2 AFI: 2/2 Umbilical Artery Dopplers were performed due to the [facility] being <2.3%. Systolic and Diastolic blood flow in each umbilical artery appear normal and without reversal or absence of diastolic flow. The maximum S/D ratio is 2.76. According to perinatology.com, this ratio is normal for this gestational age. Impression: 1. [redacted]w[redacted]d Viable Singleton Intrauterine pregnancy previously established criteria. 2. Growth is 22.0 %ile.  AFI is 8.1 cm. 3. BPP is 8/8. 4. Normal umbilical artery dopplers. 5. The right kidney is not seen today. The right adrenal gland is clearly seen. 6. Normal appearing left kidney. Recommendations: 1.Clinical correlation with the patient's History and Physical Exam. Deanna Artis, RT I have reviewed this ultrasound and the report. I agree with the above assessment and plan. Adelene Idler MD [facility] 08/28/20 3:06 PM    US FETAL BPP WO NON STRESS  Result Date: 08/28/2020 Patient Name: Doris Thomas DOB: 01/03/1996 MRN: [id number] ULTRASOUND REPORT Location: [facility] Date of Service: 08/15/2020 Indications:growth/afi Findings: Mason Jim  intrauterine pregnancy is visualized with FHR at 156 BPM. Biometrics give an (U/S) Gestational age of [redacted]w[redacted]d and an (U/S) EDD of 09/03/2020; this correlates with the clinically established Estimated Date of Delivery: 08/24/20. Fetal presentation is Cephalic. Placenta: anterior. Grade: 2 AFI: 8.1 cm Growth percentile is 22.0.  AC percentile is <2.3. EFW: 2977 g  ( 6 lb 9 oz ) No right kidney is seen in the right renal fossa today. Normal appearing left kidney, stomach and bladder. BPP Scoring: Movement: 2/2 Tone: 2/2 Breathing: 2/2 AFI: 2/2 Umbilical Artery Dopplers were performed due to the [facility] being <2.3%. Systolic and Diastolic blood flow in each umbilical artery appear normal and without reversal or absence of diastolic flow. The maximum S/D ratio is 2.76. According to perinatology.com, this ratio is normal for this gestational age. Impression: 1. [redacted]w[redacted]d Viable Singleton Intrauterine pregnancy previously established criteria. 2. Growth is 22.0 %ile.  AFI is 8.1 cm. 3. BPP is 8/8. 4. Normal umbilical artery dopplers. 5. The right kidney is not seen today. The right adrenal gland is clearly seen. 6. Normal appearing left kidney. Recommendations: 1.Clinical correlation with the patient's History and Physical Exam. Deanna Artis, RT I have  reviewed this ultrasound and the report. I agree with the above assessment and plan. Adelene Idler MD [facility] 08/28/20 3:07 PM    US Venous Img Lower Unilateral Right (DVT)  Result Date: 08/29/2020 CLINICAL DATA:  Pulmonary embolism, right leg swelling EXAM: RIGHT LOWER EXTREMITY VENOUS DOPPLER ULTRASOUND TECHNIQUE: Gray-scale sonography with compression, as well as color and duplex ultrasound, were performed to evaluate the deep venous system(s) from the level of the common femoral vein through the popliteal and proximal calf veins. COMPARISON:  None. FINDINGS: VENOUS Normal compressibility of the common femoral, superficial femoral, and  popliteal veins, as well as the visualized calf veins. Visualized portions of profunda femoral vein and great saphenous vein unremarkable. No filling defects to suggest DVT on grayscale or color Doppler imaging. Doppler waveforms show normal direction of venous flow, normal respiratory plasticity and response to augmentation. Limited views of the contralateral common femoral vein are unremarkable. OTHER None. Limitations: none IMPRESSION: Negative. Electronically Signed   By: Helyn Numbers MD   On: 08/29/2020 01:39   [place] UA Cord Doppler  Result Date: 08/28/2020 Patient Name: Doris Thomas DOB: 10-Feb-1996 MRN: [id number] ULTRASOUND REPORT Location: [facility] Date of Service: 08/15/2020 Indications:growth/afi Findings: Mason Jim intrauterine pregnancy is visualized with FHR at 156 BPM. Biometrics give an (U/S) Gestational age of [redacted]w[redacted]d and an (U/S) EDD of 09/03/2020; this correlates with the clinically established Estimated Date of Delivery: 08/24/20. Fetal presentation is Cephalic. Placenta: anterior. Grade: 2 AFI: 8.1 cm Growth percentile is 22.0.  AC percentile is <2.3. EFW: 2977 g  ( 6 lb 9 oz ) No right kidney is seen in the right renal fossa today. Normal appearing left kidney, stomach and bladder. BPP Scoring: Movement: 2/2 Tone: 2/2 Breathing: 2/2 AFI: 2/2 Umbilical Artery Dopplers were performed due to the [facility] being <2.3%. Systolic and Diastolic blood flow in each umbilical artery appear normal and without reversal or absence of diastolic flow. The maximum S/D ratio is 2.76. According to perinatology.com, this ratio is normal for this gestational age. Impression: 1. [redacted]w[redacted]d Viable Singleton Intrauterine pregnancy previously established criteria. 2. Growth is 22.0 %ile.  AFI is 8.1 cm. 3. BPP is 8/8. 4. Normal umbilical artery dopplers. 5. The right kidney is not seen today. The right adrenal gland is clearly seen. 6. Normal appearing left kidney. Recommendations: 1.Clinical correlation with the patient's  History and Physical Exam. Deanna Artis, RT I have reviewed this ultrasound and the report. I agree with the above assessment and plan. Adelene Idler MD [facility] 08/28/20 3:08 PM      Subjective: Patient was feeling better when seen today.  Chest pain is improving.  Denies any shortness of breath.  Mother was in the room.  Discussed the risk of anticoagulation and importance in the setting of recent PE.  Patient seems understanding and we will proceed with Xarelto as she is not breast-feeding.  Discharge Exam: Vitals:   08/29/20 0905 08/29/20 1145  BP: 138/74 134/81  Pulse: 80 68  Resp: 18 17  Temp:    SpO2: 99% 99%   Vitals:   08/29/20 0530 08/29/20 0600 08/29/20 0905 08/29/20 1145  BP: 117/73 132/87 138/74 134/81  Pulse: 91 90 80 68  Resp: 17 (!) 21 18 17   Temp:      TempSrc:      SpO2: 94% 99% 99% 99%  Weight:      Height:        General: Pt  is alert, awake, not in acute distress Cardiovascular: RRR, S1/S2 +, no rubs, no gallops Respiratory: CTA bilaterally, no wheezing, no rhonchi Abdominal: Soft, NT, ND, bowel sounds + Extremities: no edema, no cyanosis   The results of significant diagnostics from this hospitalization (including imaging, microbiology, ancillary and laboratory) are listed below for reference.    Microbiology: Recent Results (from the past 240 hour(s))  Resp Panel by RT PCR (RSV, Flu A&B, Covid) - Nasopharyngeal Swab     Status: None   Collection Time: 08/27/20  9:18 PM   Specimen: Nasopharyngeal Swab  Result Value Ref Range Status   SARS Coronavirus 2 by RT PCR NEGATIVE NEGATIVE Final    Comment: (NOTE) SARS-CoV-2 target nucleic acids are NOT DETECTED.  The SARS-CoV-2 RNA is generally detectable in upper respiratoy specimens during the acute phase of infection. The lowest concentration of SARS-CoV-2 viral copies this assay can detect is 131 copies/mL. A negative result does not preclude  SARS-Cov-2 infection and should not be used as the sole basis for treatment or other patient management decisions. A negative result may occur with  improper specimen collection/handling, submission of specimen other than nasopharyngeal swab, presence of viral mutation(s) within the areas targeted by this assay, and inadequate number of viral copies (<131 copies/mL). A negative result must be combined with clinical observations, patient history, and epidemiological information. The expected result is Negative.  Fact Sheet for Patients:  https://www.moore.com/  Fact Sheet for Healthcare Providers:  https://www.young.biz/  This test is no t yet approved or cleared by the [place] FDA and  has been authorized for detection and/or diagnosis of SARS-CoV-2 by FDA under an Emergency Use Authorization (EUA). This EUA will remain  in effect (meaning this test can be used) for the duration of the COVID-19 declaration under Section 564(b)(1) of the Act, 21 U.S.C. section 360bbb-3(b)(1), unless the authorization is terminated or revoked sooner.     Influenza A by PCR NEGATIVE NEGATIVE Final   Influenza B by PCR NEGATIVE NEGATIVE Final    Comment: (NOTE) The Xpert Xpress SARS-CoV-2/FLU/RSV assay is intended as an aid in  the diagnosis of influenza from Nasopharyngeal swab specimens and  should not be used as a sole basis for treatment. Nasal washings and  aspirates are unacceptable for Xpert Xpress SARS-CoV-2/FLU/RSV  testing.  Fact Sheet for Patients: https://www.moore.com/  Fact Sheet for Healthcare Providers: https://www.young.biz/  This test is not yet approved or cleared by the [place] FDA and  has been authorized for detection and/or diagnosis of SARS-CoV-2 by  FDA under an Emergency Use Authorization (EUA). This EUA will remain  in effect (meaning this test can be used) for the duration of the   Covid-19 declaration under Section 564(b)(1) of the Act, 21  U.S.C. section 360bbb-3(b)(1), unless the authorization is  terminated or revoked.    Respiratory Syncytial Virus by PCR NEGATIVE NEGATIVE Final    Comment: (NOTE) Fact Sheet for Patients: https://www.moore.com/  Fact Sheet for Healthcare Providers: https://www.young.biz/  This test is not yet approved or cleared by the [place] FDA and  has been authorized for detection and/or diagnosis of SARS-CoV-2 by  FDA under an Emergency Use Authorization (EUA). This EUA will remain  in effect (meaning this test can be used) for the duration of the  COVID-19 declaration under Section 564(b)(1) of the Act, 21 U.S.C.  section 360bbb-3(b)(1), unless the authorization is terminated or  revoked. Performed at [facility], [address], [place], [place] [address]  Labs: BNP (last 3 results) No results for input(s): BNP in the last 8760 hours. Basic Metabolic Panel: Recent Labs  Lab 08/27/20 2118 08/29/20 0009 08/29/20 0240  NA 138 137 135  K 3.6 3.5 3.4*  CL 103 102 103  CO2 24 25 22   GLUCOSE 104* 94 93  BUN 7 9 9   CREATININE 0.70 0.72 0.75  CALCIUM 9.0 9.0 8.8*   Liver Function Tests: Recent Labs  Lab 08/29/20 0009  AST 15  ALT 14  ALKPHOS 95  BILITOT 0.8  PROT 8.0  ALBUMIN 3.4*   No results for input(s): LIPASE, AMYLASE in the last 168 hours. No results for input(s): AMMONIA in the last 168 hours. CBC: Recent Labs  Lab 08/27/20 2118 08/29/20 0009 08/29/20 0240  WBC 13.7* 10.3 10.4  HGB 10.8* 10.4* 10.2*  HCT 36.0 34.0* 31.8*  MCV 82.2 81.7 78.5*  PLT 367 332 339   Cardiac Enzymes: No results for input(s): CKTOTAL, CKMB, CKMBINDEX, TROPONINI in the last 168 hours. BNP: Invalid input(s): POCBNP CBG: No results for input(s): GLUCAP in the last 168 hours. D-Dimer No results for input(s): DDIMER in the last 72 hours. Hgb A1c No results for  input(s): HGBA1C in the last 72 hours. Lipid Profile No results for input(s): CHOL, HDL, LDLCALC, TRIG, CHOLHDL, LDLDIRECT in the last 72 hours. Thyroid function studies No results for input(s): TSH, T4TOTAL, T3FREE, THYROIDAB in the last 72 hours.  Invalid input(s): FREET3 Anemia work up Recent Labs    08/29/20 0240  VITAMINB12 281  FOLATE 29.0  TIBC 379  IRON 22*   Urinalysis    Component Value Date/Time   COLORURINE AMBER BIOCHEMICALS MAY BE AFFECTED BY COLOR (A) 03/21/2011 1941   APPEARANCEUR CLEAR 03/21/2011 1941   LABSPEC 1.029 03/21/2011 1941   PHURINE 5.5 03/21/2011 1941   GLUCOSEU Negative 08/15/2020 1116   HGBUR SMALL (A) 03/21/2011 1941   BILIRUBINUR 1+ 03/04/2018 0901   KETONESUR NEGATIVE 03/21/2011 1941   PROTEINUR 1+ 03/04/2018 0901   PROTEINUR NEGATIVE 03/21/2011 1941   UROBILINOGEN 2.0 (A) 03/04/2018 0901   UROBILINOGEN 0.2 03/21/2011 1941   NITRITE Negative 03/04/2018 0901   NITRITE NEGATIVE 03/21/2011 1941   LEUKOCYTESUR Negative 03/04/2018 0901   Sepsis Labs Invalid input(s): PROCALCITONIN,  WBC,  LACTICIDVEN Microbiology Recent Results (from the past 240 hour(s))  Resp Panel by RT PCR (RSV, Flu A&B, Covid) - Nasopharyngeal Swab     Status: None   Collection Time: 08/27/20  9:18 PM   Specimen: Nasopharyngeal Swab  Result Value Ref Range Status   SARS Coronavirus 2 by RT PCR NEGATIVE NEGATIVE Final    Comment: (NOTE) SARS-CoV-2 target nucleic acids are NOT DETECTED.  The SARS-CoV-2 RNA is generally detectable in upper respiratoy specimens during the acute phase of infection. The lowest concentration of SARS-CoV-2 viral copies this assay can detect is 131 copies/mL. A negative result does not preclude SARS-Cov-2 infection and should not be used as the sole basis for treatment or other patient management decisions. A negative result may occur with  improper specimen collection/handling, submission of specimen other than nasopharyngeal swab,  presence of viral mutation(s) within the areas targeted by this assay, and inadequate number of viral copies (<131 copies/mL). A negative result must be combined with clinical observations, patient history, and epidemiological information. The expected result is Negative.  Fact Sheet for Patients:  https://www.moore.com/https://www.fda.gov/media/142436/download  Fact Sheet for Healthcare Providers:  https://www.young.biz/https://www.fda.gov/media/142435/download  This test is no t yet approved or cleared by the [place] FDA  and  has been authorized for detection and/or diagnosis of SARS-CoV-2 by FDA under an Emergency Use Authorization (EUA). This EUA will remain  in effect (meaning this test can be used) for the duration of the COVID-19 declaration under Section 564(b)(1) of the Act, 21 U.S.C. section 360bbb-3(b)(1), unless the authorization is terminated or revoked sooner.     Influenza A by PCR NEGATIVE NEGATIVE Final   Influenza B by PCR NEGATIVE NEGATIVE Final    Comment: (NOTE) The Xpert Xpress SARS-CoV-2/FLU/RSV assay is intended as an aid in  the diagnosis of influenza from Nasopharyngeal swab specimens and  should not be used as a sole basis for treatment. Nasal washings and  aspirates are unacceptable for Xpert Xpress SARS-CoV-2/FLU/RSV  testing.  Fact Sheet for Patients: https://www.moore.com/  Fact Sheet for Healthcare Providers: https://www.young.biz/  This test is not yet approved or cleared by the [place] FDA and  has been authorized for detection and/or diagnosis of SARS-CoV-2 by  FDA under an Emergency Use Authorization (EUA). This EUA will remain  in effect (meaning this test can be used) for the duration of the  Covid-19 declaration under Section 564(b)(1) of the Act, 21  U.S.C. section 360bbb-3(b)(1), unless the authorization is  terminated or revoked.    Respiratory Syncytial Virus by PCR NEGATIVE NEGATIVE Final    Comment: (NOTE) Fact Sheet for  Patients: https://www.moore.com/  Fact Sheet for Healthcare Providers: https://www.young.biz/  This test is not yet approved or cleared by the [place] FDA and  has been authorized for detection and/or diagnosis of SARS-CoV-2 by  FDA under an Emergency Use Authorization (EUA). This EUA will remain  in effect (meaning this test can be used) for the duration of the  COVID-19 declaration under Section 564(b)(1) of the Act, 21 U.S.C.  section 360bbb-3(b)(1), unless the authorization is terminated or  revoked. Performed at [facility], [address] [place], [place] [address]     Time coordinating discharge: Over 30 minutes  SIGNED:  Arnetha Courser, MD  [facility] 08/29/2020, 1:49 PM  If 7PM-7AM, please contact night-coverage [email or web address]  This record has been created using Conservation officer, historic buildings. Errors have been sought and corrected,but may not always be located. Such creation errors do not reflect on the standard of care.

## 2020-08-29 NOTE — ED Notes (Signed)
Pt denies needs.  

## 2020-08-29 NOTE — ED Notes (Signed)
Pt taken to endo via stretcher °

## 2020-08-29 NOTE — ED Provider Notes (Signed)
I assumed care of the patient from Dr. Derrill Kay at 11:00 PM.  Patient presenting with history concerning for possible pulmonary emboli which is confirmed on CT angiogram of the chest which revealed acute pulmonary embolism involving the left lower lobe with a developing pulmonary infarct and associated trace pleural effusion no right heart strain.  Spoke with the patient who informed me that she has no personal history of clotting disorder or familial history of clotting disorder.  Patient continues to admit to left-sided chest discomfort.  Patient given Lovenox 1 mg/kg.  Patient subsequently discussed with Dr. Cyndia Bent for hospital admission for further evaluation and management.   Darci Current, MD 08/29/20 0001

## 2020-08-29 NOTE — ED Notes (Signed)
Waiting on lovenox and labatelol from pharmacy.

## 2020-08-29 NOTE — H&P (Signed)
History and Physical    Doris Thomas WUJ:[id number] DOB: Aug 12, 1996 DOA: 08/28/2020  PCP: Particia NearingLane, Rachel Elizabeth, PA-C  Patient coming from: Home  I have personally briefly reviewed patient's [facility]  Chief Complaint: back and chest pain   HPI: Doris Thomas is a 24 y.o. female with medical history significant for recent pregnancy with delivery on 08/16/2020 who presented with acute onset of left-sided chest pain and back pain.  Symptoms started acutely about 2 days ago she began to notice up and down left-sided chest pain as well as back pain.  It was especially worse with laying down.  She also noticed some shortness of breath with dyspnea.  She states that her blood pressure were somewhat elevated during her recent pregnancy but was never diagnosed with gestational hypertension or preeclampsia.  She was later started on labetalol after some elevated blood pressure reading postpartum.  Denies any other complications during her pregnancy or her vaginal delivery. The only thing is that for the past 2 months she has noticed bilateral lower extremity swelling worse on the right and that improved 2 days ago before the start of her current symptoms. She denies any tobacco use.  No personal or family history of hypercoagulability.  She is not on any hormonal contraceptives at this time.  In the ED, she was mildly tachycardic around 110 was mildly hypertensive up to systolic of 150s. Stable on room air.   CTA chest was obtained showing left-sided PE with developing pulmonary infarct but no evidence of right heart strain.  No leukocytosis.  Has normocytic anemia with hemoglobin of 10.4.  BMP otherwise unremarkable.  Review of Systems: Constitutional: No Weight Change, No Fever ENT/Mouth: No sore throat, No Rhinorrhea Eyes: No Eye Pain, No Vision Changes Cardiovascular: + Chest Pain, + SOB Respiratory: No Cough, No Sputum, No Wheezing, no Dyspnea    Gastrointestinal: No Nausea, No Vomiting, No Diarrhea, No Constipation, No Pain Genitourinary: no Urinary Incontinence Musculoskeletal: No Arthralgias, No Myalgias Skin: No Skin Lesions, No Pruritus, Neuro: no Weakness, No Numbness Psych: No Anxiety/Panic, No Depression, no decrease appetite Heme/Lymph: No Bruising, No Bleeding  Past Medical History:  Diagnosis Date  . Medical history non-contributory   . Vitamin D deficiency     Past Surgical History:  Procedure Laterality Date  . NO PAST SURGERIES       reports that she has never smoked. She has never used smokeless tobacco. She reports that she does not drink alcohol and does not use drugs. Social History  No Known Allergies  Family History  Problem Relation Age of Onset  . Osteogenesis imperfecta Father        6 of patient's siblings also have osteogenesis imperfecta  . Cancer Maternal Grandmother   . Diabetes Paternal Grandmother   . Osteogenesis imperfecta Niece        sister's daughter  . Osteogenesis imperfecta Nephew        brother's son     Prior to Admission medications   Medication Sig Start Date End Date Taking? Authorizing Provider  acetaminophen (TYLENOL) 325 MG tablet Take 650 mg by mouth every 6 (six) hours as needed.    [provider]  clindamycin (CLEOCIN-T) 1 % external solution Apply topically 2 (two) times daily. 07/24/20   Schuman, Jaquelyn Bitterhristanna R, MD  ibuprofen (ADVIL) 600 MG tablet Take 1 tablet (600 mg total) by mouth every 6 (six) hours. 08/18/20   Mirna MiresFryer, Margaret M, CNM  labetalol (NORMODYNE)  200 MG tablet Take 1 tablet (200 mg total) by mouth 2 (two) times daily. 08/18/20   Mirna Mires, CNM  Prenat-FeFum-FePo-FA-Omega 3 (CONCEPT DHA) 53.5-38-1 MG CAPS  01/08/20   [provider]    Physical Exam: Vitals:   08/28/20 2112 08/28/20 2200 08/29/20 0000 08/29/20 0030  BP: (!) 153/90 (!) 153/94 (!) 149/98 (!) 144/90  Pulse: 92 93 94 92  Resp: 18 18 (!) 22 15  Temp: 98.9 F  (37.2 C)     TempSrc: Oral     SpO2: 100% 100% 96% 100%  Weight:      Height:        Constitutional: NAD, calm, comfortable, well-appearing young female sitting upright in bed Vitals:   08/28/20 2112 08/28/20 2200 08/29/20 0000 08/29/20 0030  BP: (!) 153/90 (!) 153/94 (!) 149/98 (!) 144/90  Pulse: 92 93 94 92  Resp: 18 18 (!) 22 15  Temp: 98.9 F (37.2 C)     TempSrc: Oral     SpO2: 100% 100% 96% 100%  Weight:      Height:       Eyes: PERRL, lids and conjunctivae normal ENMT: Mucous membranes are moist.  Neck: normal, supple Respiratory: clear to auscultation bilaterally, no wheezing, no crackles. Normal respiratory effort on room air. No accessory muscle use.  Cardiovascular: Regular rate and rhythm, no murmurs / rubs / gallops. No extremity edema. 2+ pedal pulses. No chest wall pain with palpation. Abdomen: no tenderness, no masses palpated.  Bowel sounds positive.  Musculoskeletal: no clubbing / cyanosis. No joint deformity upper and lower extremities. Good ROM, no contractures. Normal muscle tone. No calf tenderness bilaterally. No palpable cords. Skin: no rashes, lesions, ulcers. No induration Neurologic: CN 2-12 grossly intact. Sensation intact, Strength 5/5 in all 4.  Psychiatric: Normal judgment and insight. Alert and oriented x 3. Normal mood.     Labs on Admission: I have personally reviewed following labs and imaging studies  CBC: Recent Labs  Lab 08/27/20 2118 08/29/20 0009  WBC 13.7* 10.3  HGB 10.8* 10.4*  HCT 36.0 34.0*  MCV 82.2 81.7  PLT 367 332   Basic Metabolic Panel: Recent Labs  Lab 08/27/20 2118  NA 138  K 3.6  CL 103  CO2 24  GLUCOSE 104*  BUN 7  CREATININE 0.70  CALCIUM 9.0   GFR: Estimated Creatinine Clearance: 132 mL/min (by C-G formula based on SCr of 0.7 mg/dL). Liver Function Tests: No results for input(s): AST, ALT, ALKPHOS, BILITOT, PROT, ALBUMIN in the last 168 hours. No results for input(s): LIPASE, AMYLASE in the last  168 hours. No results for input(s): AMMONIA in the last 168 hours. Coagulation Profile: No results for input(s): INR, PROTIME in the last 168 hours. Cardiac Enzymes: No results for input(s): CKTOTAL, CKMB, CKMBINDEX, TROPONINI in the last 168 hours. BNP (last 3 results) No results for input(s): PROBNP in the last 8760 hours. HbA1C: No results for input(s): HGBA1C in the last 72 hours. CBG: No results for input(s): GLUCAP in the last 168 hours. Lipid Profile: No results for input(s): CHOL, HDL, LDLCALC, TRIG, CHOLHDL, LDLDIRECT in the last 72 hours. Thyroid Function Tests: No results for input(s): TSH, T4TOTAL, FREET4, T3FREE, THYROIDAB in the last 72 hours. Anemia Panel: No results for input(s): VITAMINB12, FOLATE, FERRITIN, TIBC, IRON, RETICCTPCT in the last 72 hours. Urine analysis:    Component Value Date/Time   COLORURINE AMBER BIOCHEMICALS MAY BE AFFECTED BY COLOR (A) 03/21/2011 1941   APPEARANCEUR CLEAR 03/21/2011 1941  LABSPEC 1.029 03/21/2011 1941   PHURINE 5.5 03/21/2011 1941   GLUCOSEU Negative 08/15/2020 1116   HGBUR SMALL (A) 03/21/2011 1941   BILIRUBINUR 1+ 03/04/2018 0901   KETONESUR NEGATIVE 03/21/2011 1941   PROTEINUR 1+ 03/04/2018 0901   PROTEINUR NEGATIVE 03/21/2011 1941   UROBILINOGEN 2.0 (A) 03/04/2018 0901   UROBILINOGEN 0.2 03/21/2011 1941   NITRITE Negative 03/04/2018 0901   NITRITE NEGATIVE 03/21/2011 1941   LEUKOCYTESUR Negative 03/04/2018 0901    Radiological Exams on Admission: DG Chest 2 View  Result Date: 08/27/2020 CLINICAL DATA:  Shortness of breath EXAM: CHEST - 2 VIEW COMPARISON:  None FINDINGS: Cardiac shadow is within normal limits. The lungs are well aerated bilaterally. Mild bibasilar atelectatic changes are seen. No focal infiltrate is noted. No sizable effusion is seen. No bony abnormality is noted. IMPRESSION: Mild bibasilar atelectasis. Electronically Signed   By: Alcide Clever M.D.   On: 08/27/2020 21:47   CT Angio Chest PE W and/or  Wo Contrast  Result Date: 08/28/2020 CLINICAL DATA:  Back pain, dyspnea EXAM: CT ANGIOGRAPHY CHEST WITH CONTRAST TECHNIQUE: Multidetector CT imaging of the chest was performed using the standard protocol during bolus administration of intravenous contrast. Multiplanar CT image reconstructions and MIPs were obtained to evaluate the vascular anatomy. CONTRAST:  OMNIPAQUE IOHEXOL 350 MG/ML SOLN COMPARISON:  None. FINDINGS: Cardiovascular: There is adequate opacification of the pulmonary arterial tree. There is a a intraluminal filling defect seen within the posterobasal segmental pulmonary artery of the left lower lobe in keeping with acute pulmonary embolism. The embolic burden is small. There is no CT evidence of right heart strain. The central pulmonary arteries are of normal caliber. No significant coronary artery calcification. Global cardiac size within normal limits. No pericardial effusion. Thoracic aorta is normal. Mediastinum/Nodes: No enlarged mediastinal, hilar, or axillary lymph nodes. Thyroid gland, trachea, and esophagus demonstrate no significant findings. Lungs/Pleura: There is focal consolidation within the posterior basal left lower lobe compatible with a a developing pulmonary infarct. Trace left pleural effusion is present. The lungs are otherwise clear. No pneumothorax. Central airways are widely patent. Upper Abdomen: No acute abnormality. Musculoskeletal: No chest wall abnormality. No acute or significant osseous findings. Review of the MIP images confirms the above findings. IMPRESSION: Acute pulmonary embolism involving the left lower lobe with developing pulmonary infarct and associated trace pleural effusion. Embolic burden is small. No CT evidence of right heart strain. Electronically Signed   By: Helyn Numbers MD   On: 08/28/2020 23:37      Assessment/Plam Acute pulmonary embolism Recent vaginal delivery on 08/17/2011 Possibly isolated event due to her high risk being  pregnant.  No personal or familial history of hypercoagulability. No CT evidence of right heart strain Obtain right lower extremity ultrasound to assess for DVT Continue Lovenox injections started in the ED Patient not breast-feeding  ? gestational hypertension Pt was started by OB on Labetalol for elevated BP postpartum. Also elevated to systolic of 150s here.  Continue antihypertensive and will need to continue to follow up outpatient   Anemia likely due to recent delivery since previously normal in June 2021 check iron panel/folic/vitaminB12  DVT prophylaxis:.Lovenox VTE treatment Code Status: Full Family Communication: Plan discussed with patient at bedside  disposition Plan: Home with observation Consults called:  Admission status: Observation  Status is: Observation  The patient remains OBS appropriate and will d/c before 2 midnights.  Dispo: The patient is from: Home  Anticipated d/c is to: Home              Anticipated d/c date is: 1 day              Patient currently is not medically stable to d/c.         Anselm Jungling DO [facility]   If 7PM-7AM, please contact night-coverage [email or web address]   08/29/2020, 12:39 AM

## 2020-08-29 NOTE — Discharge Instructions (Signed)
Information on my medicine - XARELTO (rivaroxaban)  This medication education was reviewed with me or my healthcare representative as part of my discharge preparation.    WHY WAS XARELTO PRESCRIBED FOR YOU? Xarelto was prescribed to treat blood clots that may have been found in the veins of your legs (deep vein thrombosis) or in your lungs (pulmonary embolism) and to reduce the risk of them occurring again.  What do you need to know about Xarelto? The starting dose is one 15 mg tablet taken TWICE daily with food for the FIRST 21 DAYS then on 09/19/20  the dose is changed to one 20 mg tablet taken ONCE A DAY with your evening meal.  DO NOT stop taking Xarelto without talking to the health care provider who prescribed the medication.  Refill your prescription for 20 mg tablets before you run out.  After discharge, you should have regular check-up appointments with your healthcare provider that is prescribing your Xarelto.  In the future your dose may need to be changed if your kidney function changes by a significant amount.  What do you do if you miss a dose? If you are taking Xarelto TWICE DAILY and you miss a dose, take it as soon as you remember. You may take two 15 mg tablets (total 30 mg) at the same time then resume your regularly scheduled 15 mg twice daily the next day.  If you are taking Xarelto ONCE DAILY and you miss a dose, take it as soon as you remember on the same day then continue your regularly scheduled once daily regimen the next day. Do not take two doses of Xarelto at the same time.   Important Safety Information Xarelto is a blood thinner medicine that can cause bleeding. You should call your healthcare provider right away if you experience any of the following: ? Bleeding from an injury or your nose that does not stop. ? Unusual colored urine (red or dark brown) or unusual colored stools (red or black). ? Unusual bruising for unknown reasons. ? A serious fall  or if you hit your head (even if there is no bleeding).  Some medicines may interact with Xarelto and might increase your risk of bleeding while on Xarelto. To help avoid this, consult your healthcare provider or pharmacist prior to using any new prescription or non-prescription medications, including herbals, vitamins, non-steroidal anti-inflammatory drugs (NSAIDs) and supplements.  This website has more information on Xarelto: VisitDestination.com.br.

## 2020-08-29 NOTE — Progress Notes (Signed)
ANTICOAGULATION CONSULT NOTE - Initial Consult  Pharmacy Consult for Lovenox  Indication: DVT  No Known Allergies  Patient Measurements: Height: 5\' 5"  (165.1 cm) Weight: 108.9 kg (240 lb) IBW/kg (Calculated) : 57 Heparin Dosing Weight:   Vital Signs: Temp: 98.9 F (37.2 C) (09/22 2112) Temp Source: Oral (09/22 2112) BP: 144/90 (09/23 0030) Pulse Rate: 92 (09/23 0030)  Labs: Recent Labs    08/27/20 2118 08/29/20 0009  HGB 10.8* 10.4*  HCT 36.0 34.0*  PLT 367 332  CREATININE 0.70  --   TROPONINIHS 4  --     Estimated Creatinine Clearance: 132 mL/min (by C-G formula based on SCr of 0.7 mg/dL).   Medical History: Past Medical History:  Diagnosis Date  . Medical history non-contributory   . Vitamin D deficiency     Medications:  (Not in a hospital admission)   Assessment: Pharmacy consulted to dose lovenox in this 24 year old female admitted with DVT.   CrCl = 132 ml/min No prior anticoag noted  TBW = 108.9 kg   Goal of Therapy:  Resolution of DVT  Monitor platelets by anticoagulation protocol: Yes   Plan:  Lovenox 110 mg IV Q12H   Jazmene Racz D 08/29/2020,12:44 AM

## 2020-08-29 NOTE — Progress Notes (Signed)
ANTICOAGULATION CONSULT NOTE - Initial Consult  Pharmacy Consult for Rivaroxaban (Xarelto)  Indication: pulmonary embolus  No Known Allergies  Patient Measurements: Height: 5\' 5"  (165.1 cm) Weight: 108.9 kg (240 lb) IBW/kg (Calculated) : 57  Vital Signs: BP: 138/74 (09/23 0905) Pulse Rate: 80 (09/23 0905)  Labs: Recent Labs    08/27/20 2118 08/27/20 2118 08/29/20 0009 08/29/20 0240  HGB 10.8*   < > 10.4* 10.2*  HCT 36.0  --  34.0* 31.8*  PLT 367  --  332 339  CREATININE 0.70  --  0.72 0.75  TROPONINIHS 4  --   --   --    < > = values in this interval not displayed.    Estimated Creatinine Clearance: 132 mL/min (by C-G formula based on SCr of 0.75 mg/dL).   Medical History: Past Medical History:  Diagnosis Date  . Medical history non-contributory   . Vitamin D deficiency      Assessment: 24 yo female admitted with back and chest pain. CT chest shows acute pulmonary embolism involving the left lower lobe with developing pulmonary infarct and associated trace pleural effusion. Patient was started on treatment dose enoxaparin 9/23. Pharmacy now consulted for Rivaroxaban (Xarelto) dosing. Last dose of enoxaparin was 9/23 @ 0049.     Plan:  Will start Rivaroxaban 15mg  BID for 21 days, followed by rivaroxaban 20mg  daily.   Continue to monitor Hgb/Hct/Plt.   10/23, PharmD, BCPS Clinical Pharmacist 08/29/2020 9:41 AM

## 2020-08-29 NOTE — ED Notes (Signed)
Report to linda, rn.  

## 2020-09-02 ENCOUNTER — Ambulatory Visit (INDEPENDENT_AMBULATORY_CARE_PROVIDER_SITE_OTHER): Payer: Medicaid Other | Admitting: Advanced Practice Midwife

## 2020-09-02 ENCOUNTER — Other Ambulatory Visit: Payer: Self-pay

## 2020-09-02 VITALS — BP 120/72 | HR 84 | Resp 16 | Ht 65.0 in | Wt 217.6 lb

## 2020-09-02 DIAGNOSIS — Z0130 Encounter for examination of blood pressure without abnormal findings: Secondary | ICD-10-CM

## 2020-09-03 ENCOUNTER — Encounter: Payer: Self-pay | Admitting: Advanced Practice Midwife

## 2020-09-03 ENCOUNTER — Telehealth: Payer: Self-pay | Admitting: Family Medicine

## 2020-09-03 NOTE — Telephone Encounter (Signed)
Tried calling patient, no answer and VM full. Will try to call again later.  °

## 2020-09-03 NOTE — Progress Notes (Signed)
Obstetrics & Gynecology Office Visit   Chief Complaint:  Chief Complaint  Patient presents with  . Postpartum Care    History of Present Illness: 24 y.o. G1P1001 being seen for follow up blood pressure check today.  The patient is 2 weeks 3 days postpartum. The established diagnosis for the patient is gestational hypertension.  She is currently on labetalol 200mg  BID.  She reports no current symptoms attributable to her blood pressure.  Medication list reviewed medications which may contribute to BP elevation were not noted.  The patient was seen in the ER on 9/22 and 9/23 for left side chest and back pain. She was diagnosed with left side PE and has received anti- coagulant therapy. Initially treated with Lovenox and then discharged on Xarelto. She has close follow up with PCP regarding management.   She has not taken her Labetalol today.  Review of Systems:  Review of Systems  Constitutional: Negative for chills and fever.  HENT: Negative for congestion, ear discharge, ear pain, hearing loss, sinus pain and sore throat.   Eyes: Negative for blurred vision and double vision.  Respiratory: Negative for cough, shortness of breath and wheezing.   Cardiovascular: Negative for chest pain, palpitations and leg swelling.       Positive for some residual chest/back pain following PE  Gastrointestinal: Negative for abdominal pain, blood in stool, constipation, diarrhea, heartburn, melena, nausea and vomiting.  Genitourinary: Negative for dysuria, flank pain, frequency, hematuria and urgency.  Musculoskeletal: Negative for back pain, joint pain and myalgias.  Skin: Negative for itching and rash.  Neurological: Negative for dizziness, tingling, tremors, sensory change, speech change, focal weakness, seizures, loss of consciousness, weakness and headaches.  Endo/Heme/Allergies: Negative for environmental allergies. Does not bruise/bleed easily.  Psychiatric/Behavioral: Negative for  depression, hallucinations, memory loss, substance abuse and suicidal ideas. The patient is not nervous/anxious and does not have insomnia.      Past Medical History:  Past Medical History:  Diagnosis Date  . Medical history non-contributory   . Vitamin D deficiency     Past Surgical History:  Past Surgical History:  Procedure Laterality Date  . NO PAST SURGERIES      Gynecologic History: No LMP recorded.  Obstetric History: G1P1001  Family History:  Family History  Problem Relation Age of Onset  . Osteogenesis imperfecta Father        6 of patient's siblings also have osteogenesis imperfecta  . Cancer Maternal Grandmother   . Diabetes Paternal Grandmother   . Osteogenesis imperfecta Niece        sister's daughter  . Osteogenesis imperfecta Nephew        brother's son    Social History:  Social History   Socioeconomic History  . Marital status: Single    Spouse name: 10/23  . Number of children: Not on file  . Years of education: Not on file  . Highest education level: Not on file  Occupational History  . Not on file  Tobacco Use  . Smoking status: Never Smoker  . Smokeless tobacco: Never Used  Vaping Use  . Vaping Use: Never used  Substance and Sexual Activity  . Alcohol use: No  . Drug use: No  . Sexual activity: Yes    Birth control/protection: None    Comment: planning depo  Other Topics Concern  . Not on file  Social History Narrative  . Not on file   Social Determinants of Health   Financial Resource Strain:   .  Difficulty of Paying Living Expenses: Not on file  Food Insecurity:   . Worried About Programme researcher, broadcasting/film/video in the Last Year: Not on file  . Ran Out of Food in the Last Year: Not on file  Transportation Needs:   . Lack of Transportation (Medical): Not on file  . Lack of Transportation (Non-Medical): Not on file  Physical Activity:   . Days of Exercise per Week: Not on file  . Minutes of Exercise per Session: Not on file  Stress:     . Feeling of Stress : Not on file  Social Connections:   . Frequency of Communication with Friends and Family: Not on file  . Frequency of Social Gatherings with Friends and Family: Not on file  . Attends Religious Services: Not on file  . Active Member of Clubs or Organizations: Not on file  . Attends Banker Meetings: Not on file  . Marital Status: Not on file  Intimate Partner Violence:   . Fear of Current or Ex-Partner: Not on file  . Emotionally Abused: Not on file  . Physically Abused: Not on file  . Sexually Abused: Not on file    Allergies:  No Known Allergies  Medications: Prior to Admission medications   Medication Sig Start Date End Date Taking? Authorizing Provider  acetaminophen (TYLENOL) 325 MG tablet Take 650 mg by mouth every 6 (six) hours as needed.   Yes [provider]  cefdinir (OMNICEF) 300 MG capsule Take by mouth. 08/28/20 09/04/20 Yes [provider]  ferrous sulfate 325 (65 FE) MG EC tablet Take 1 tablet (325 mg total) by mouth 2 (two) times daily. 08/29/20 08/29/21 Yes Arnetha Courser, MD  labetalol (NORMODYNE) 200 MG tablet Take 1 tablet (200 mg total) by mouth 2 (two) times daily. 08/18/20  Yes Mirna Mires, CNM  RIVAROXABAN Carlena Hurl) VTE STARTER PACK (15 & 20 MG TABLETS) Follow package directions: Take one 15mg  tablet by mouth twice a day. On day 22, switch to one 20mg  tablet once a day. Take with food. 08/29/20  Yes , MD  baclofen (LIORESAL) 10 MG tablet Take 10 mg by mouth 3 (three) times daily. 08/28/20   [provider]  naproxen (NAPROSYN) 250 MG tablet PLEASE SEE ATTACHED FOR DETAILED DIRECTIONS 08/28/20   [provider]    Physical Exam Blood pressure 120/72, pulse 84, resp. rate 16, height 5\' 5"  (1.651 m), weight 217 lb 9.6 oz (98.7 kg), SpO2 99 %, not currently breastfeeding.  No LMP recorded.  General: NAD HEENT: normocephalic, anicteric Pulmonary: No increased work of  breathing Cardiovascular: RRR, distal pulses 2+ Extremities: noedema, no erythema, no tenderness Neurologic: Grossly intact Psychiatric: mood appropriate, affect full  Assessment: 24 y.o. G1P1001 presenting for blood pressure evaluation today  Plan: Problem List Items Addressed This Visit    None    Visit Diagnoses    BP check    -  Primary      1) Blood pressure - blood pressure at today's visit is normotensive.  As a result antihypertensive therapy is currently not warranted. - additional blood work was not obtained 2) Return to clinic in 4 weeks for 6 week postpartum visit/Mirena IUD   08/30/20, CNM [facility] 09/03/2020, 2:45 PM

## 2020-09-03 NOTE — Telephone Encounter (Signed)
Copied from CRM [id number]. Topic: General - Inquiry >> Sep 02, 2020  5:06 PM Doris Thomas wrote: Reason for CRM: Patient called for a hospital follow up, I scheduled her with Dr. Bradly Bienenstock on 09-09-2020, but she says that she was suppose to be seen in a week and it was already been 4 days. She would like to see if she could be worked in before 09-09-2020. She would like a call back. Please advise does she need apt sooner?

## 2020-09-03 NOTE — Telephone Encounter (Signed)
Routing to provider to advise.  

## 2020-09-03 NOTE — Telephone Encounter (Signed)
Can she do 3:00 pm Thursday?

## 2020-09-04 NOTE — Telephone Encounter (Signed)
Called and LVM asking for patient to please return my call.  

## 2020-09-05 ENCOUNTER — Telehealth: Payer: Self-pay | Admitting: Family Medicine

## 2020-09-05 NOTE — Telephone Encounter (Signed)
Called and spoke to patient. She states she cannot come in today so she is just going to keep her appointment on Monday.

## 2020-09-05 NOTE — Telephone Encounter (Signed)
Pt stated she would wait until Monday due to being unable to come in the office.

## 2020-09-09 ENCOUNTER — Inpatient Hospital Stay: Payer: Self-pay | Admitting: Nurse Practitioner

## 2020-09-09 NOTE — Progress Notes (Deleted)
There were no vitals taken for this visit.   Subjective:    Patient ID: Doris Thomas, female    DOB: Sep 16, 1996, 24 y.o.   MRN: [id number]  HPI: Doris Thomas is a 24 y.o. female  No chief complaint on file.  PULMONARY EMBOLISM Duration of pulmonary embolism: Onset: Location: Left lung Character: Satisfied with current treatment: Side effects of current treatment: Medication compliance: Alleviating factors: Aggravating factors: Headache: Bleeding: Bruising: Visual disturbance: Chest pain: Shortness of breath: Back pain:  No Known Allergies  Outpatient Encounter Medications as of 09/09/2020  Medication Sig   acetaminophen (TYLENOL) 325 MG tablet Take 650 mg by mouth every 6 (six) hours as needed.   baclofen (LIORESAL) 10 MG tablet Take 10 mg by mouth 3 (three) times daily.   ferrous sulfate 325 (65 FE) MG EC tablet Take 1 tablet (325 mg total) by mouth 2 (two) times daily.   labetalol (NORMODYNE) 200 MG tablet Take 1 tablet (200 mg total) by mouth 2 (two) times daily.   naproxen (NAPROSYN) 250 MG tablet PLEASE SEE ATTACHED FOR DETAILED DIRECTIONS   RIVAROXABAN (XARELTO) VTE STARTER PACK (15 & 20 MG TABLETS) Follow package directions: Take one 15mg  tablet by mouth twice a day. On day 22, switch to one 20mg  tablet once a day. Take with food.   No facility-administered encounter medications on file as of 09/09/2020.   Patient Active Problem List   Diagnosis Date Noted   Pulmonary embolism (HCC) 08/29/2020   Elevated blood pressure reading 08/29/2020   Anemia 08/29/2020   Acute back pain    Shortness of breath    Encounter for care or examination of lactating mother 08/16/2020   Single liveborn infant delivered vaginally    Postpartum care following vaginal delivery    Intrauterine growth restriction affecting antepartum care of mother in third trimester, not applicable or unspecified fetus 08/15/2020   Intrauterine growth restriction (IUGR) affecting  care of mother, third trimester, single gestation 08/15/2020   Swelling of lower extremity during pregnancy in third trimester 08/03/2020   Supervision of high risk pregnancy, antepartum 01/04/2020   Family history of osteogenesis imperfecta 01/04/2020   Obesity affecting pregnancy, antepartum 01/04/2020   Vitamin D deficiency 10/18/2017   Encounter for contraceptive management 10/18/2017   Past Medical History:  Diagnosis Date   Medical history non-contributory    Vitamin D deficiency    Relevant past medical, surgical, family and social history reviewed and updated as indicated. Interim medical history since our last visit reviewed.  Review of Systems  Per HPI unless specifically indicated above     Objective:    There were no vitals taken for this visit.  Wt Readings from Last 3 Encounters:  09/02/20 217 lb 9.6 oz (98.7 kg)  08/28/20 240 lb (108.9 kg)  08/15/20 243 lb 9.7 oz (110.5 kg)    Physical Exam  Results for orders placed or performed during the hospital encounter of 08/28/20  CBC  Result Value Ref Range   WBC 10.3 4.0 - 10.5 K/uL   RBC 4.16 3.87 - 5.11 MIL/uL   Hemoglobin 10.4 (L) 12.0 - 15.0 g/dL   HCT 10/15/20 (L) 36 - 46 %   MCV 81.7 80.0 - 100.0 fL   MCH 25.0 (L) 26.0 - 34.0 pg   MCHC 30.6 30.0 - 36.0 g/dL   RDW 08/30/20 (H) 40.9 - 81.1 %   Platelets 332 150 - 400 K/uL   nRBC 0.0 0.0 - 0.2 %  Comprehensive metabolic  panel  Result Value Ref Range   Sodium 137 135 - 145 mmol/L   Potassium 3.5 3.5 - 5.1 mmol/L   Chloride 102 98 - 111 mmol/L   CO2 25 22 - 32 mmol/L   Glucose, Bld 94 70 - 99 mg/dL   BUN 9 6 - 20 mg/dL   Creatinine, Ser 9.56 0.44 - 1.00 mg/dL   Calcium 9.0 8.9 - 38.7 mg/dL   Total Protein 8.0 6.5 - 8.1 g/dL   Albumin 3.4 (L) 3.5 - 5.0 g/dL   AST 15 15 - 41 U/L   ALT 14 0 - 44 U/L   Alkaline Phosphatase 95 38 - 126 U/L   Total Bilirubin 0.8 0.3 - 1.2 mg/dL   GFR calc non Af Amer >60 >60 mL/min   GFR calc Af Amer >60 >60 mL/min    Anion gap 10 5 - 15  HIV Antibody (routine testing w rflx)  Result Value Ref Range   HIV Screen 4th Generation wRfx Non Reactive Non Reactive  Basic metabolic panel  Result Value Ref Range   Sodium 135 135 - 145 mmol/L   Potassium 3.4 (L) 3.5 - 5.1 mmol/L   Chloride 103 98 - 111 mmol/L   CO2 22 22 - 32 mmol/L   Glucose, Bld 93 70 - 99 mg/dL   BUN 9 6 - 20 mg/dL   Creatinine, Ser 5.64 0.44 - 1.00 mg/dL   Calcium 8.8 (L) 8.9 - 10.3 mg/dL   GFR calc non Af Amer >60 >60 mL/min   GFR calc Af Amer >60 >60 mL/min   Anion gap 10 5 - 15  CBC  Result Value Ref Range   WBC 10.4 4.0 - 10.5 K/uL   RBC 4.05 3.87 - 5.11 MIL/uL   Hemoglobin 10.2 (L) 12.0 - 15.0 g/dL   HCT 33.2 (L) 36 - 46 %   MCV 78.5 (L) 80.0 - 100.0 fL   MCH 25.2 (L) 26.0 - 34.0 pg   MCHC 32.1 30.0 - 36.0 g/dL   RDW 95.1 (H) 88.4 - 16.6 %   Platelets 339 150 - 400 K/uL   nRBC 0.0 0.0 - 0.2 %  Iron and TIBC  Result Value Ref Range   Iron 22 (L) 28 - 170 ug/dL   TIBC 063 016 - 010 ug/dL   Saturation Ratios 6 (L) 10.4 - 31.8 %   UIBC 357 ug/dL  Folate, serum, performed at [facility] lab  Result Value Ref Range   Folate 29.0 >5.9 ng/mL  Vitamin B12  Result Value Ref Range   Vitamin B-12 281 180 - 914 pg/mL      Assessment & Plan:   Problem List Items Addressed This Visit    None       Follow up plan: No follow-ups on file.

## 2020-09-10 ENCOUNTER — Other Ambulatory Visit: Payer: Self-pay | Admitting: Obstetrics

## 2020-09-18 ENCOUNTER — Other Ambulatory Visit: Payer: Self-pay

## 2020-09-20 ENCOUNTER — Ambulatory Visit: Payer: Self-pay

## 2020-09-21 ENCOUNTER — Ambulatory Visit
Admission: EM | Admit: 2020-09-21 | Discharge: 2020-09-21 | Disposition: A | Discharge status: Home / Self Care | Payer: Medicaid Other | Attending: Physician Assistant | Admitting: Physician Assistant

## 2020-09-21 ENCOUNTER — Ambulatory Visit
Admission: RE | Admit: 2020-09-21 | Discharge: 2020-09-21 | Discharge status: Against Medical Advice | Payer: Medicaid Other | Source: Ambulatory Visit | Attending: Family Medicine | Admitting: Family Medicine

## 2020-09-21 ENCOUNTER — Other Ambulatory Visit: Payer: Self-pay

## 2020-09-21 ENCOUNTER — Encounter: Payer: Self-pay | Admitting: Family Medicine

## 2020-09-21 ENCOUNTER — Encounter: Payer: Self-pay | Admitting: Emergency Medicine

## 2020-09-21 DIAGNOSIS — Z86711 Personal history of pulmonary embolism: Secondary | ICD-10-CM

## 2020-09-21 DIAGNOSIS — R8290 Unspecified abnormal findings in urine: Secondary | ICD-10-CM | POA: Insufficient documentation

## 2020-09-21 HISTORY — DX: Other pulmonary embolism without acute cor pulmonale: I26.99

## 2020-09-21 LAB — URINALYSIS, COMPLETE (UACMP) WITH MICROSCOPIC
Glucose, UA: NEGATIVE mg/dL
Leukocytes,Ua: NEGATIVE
Nitrite: NEGATIVE
Protein, ur: NEGATIVE mg/dL
Specific Gravity, Urine: 1.025 (ref 1.005–1.030)
pH: 5 (ref 5.0–8.0)

## 2020-09-21 MED ORDER — RIVAROXABAN 20 MG PO TABS: 20.0000 mg | ORAL_TABLET | Freq: Every day | ORAL | 0 refills | Status: DC

## 2020-09-21 NOTE — ED Provider Notes (Signed)
[facility]    CSN: [id number] Arrival date & time: 09/21/20  1433      History   Chief Complaint Chief Complaint  Patient presents with   urine odor    HPI Doris Thomas is a 24 y.o. female presenting for concerns about foul-smelling urine for the past week.  She says that she was seen a few weeks ago for upper back pain and had a urinalysis done which she was told could be consistent with a urinary tract infection.  Says she was started on cefdinir at that time.  Says she took the medication and noticed that she still had foul-smelling urine.  She denies ever having any symptoms of urinary tract infection.  She denies dysuria, hematuria, urinary frequency urgency.  Denies any vaginal discharge.  Patient did recently have a baby 5 weeks ago.    Patient states that she was diagnosed with a pulmonary embolism on 08/28/2020 and has been taking the starter pack of Xarelto.  He says that she does not have a PCP currently and was told when she was let out of the ER that she needed to see someone and get refills of the Xarelto for up to 3 months.  Patient denies any upper back pain currently and has no trouble breathing or fevers.  She has no other complaints or concerns today.  HPI  Past Medical History:  Diagnosis Date   Medical history non-contributory    Pulmonary embolism (HCC)    Vitamin D deficiency     Patient Active Problem List   Diagnosis Date Noted   Pulmonary embolism (HCC) 08/29/2020   Elevated blood pressure reading 08/29/2020   Anemia 08/29/2020   Acute back pain    Shortness of breath    Encounter for care or examination of lactating mother 08/16/2020   Single liveborn infant delivered vaginally    Postpartum care following vaginal delivery    Intrauterine growth restriction affecting antepartum care of mother in third trimester, not applicable or unspecified fetus 08/15/2020   Intrauterine growth restriction (IUGR) affecting care of  mother, third trimester, single gestation 08/15/2020   Swelling of lower extremity during pregnancy in third trimester 08/03/2020   Supervision of high risk pregnancy, antepartum 01/04/2020   Family history of osteogenesis imperfecta 01/04/2020   Obesity affecting pregnancy, antepartum 01/04/2020   Vitamin D deficiency 10/18/2017   Encounter for contraceptive management 10/18/2017    Past Surgical History:  Procedure Laterality Date   NO PAST SURGERIES      OB History    Gravida  1   Para  1   Term  1   Preterm  0   AB  0   Living  1     SAB  0   TAB  0   Ectopic  0   Multiple  0   Live Births  1            Home Medications    Prior to Admission medications   Medication Sig Start Date End Date Taking? Authorizing Provider  ferrous sulfate 325 (65 FE) MG EC tablet Take 1 tablet (325 mg total) by mouth 2 (two) times daily. 08/29/20 08/29/21 Yes Arnetha CourserAmin, Sumayya, MD  acetaminophen (TYLENOL) 325 MG tablet Take 650 mg by mouth every 6 (six) hours as needed.    [provider]  baclofen (LIORESAL) 10 MG tablet Take 10 mg by mouth 3 (three) times daily. 08/28/20   [provider]  labetalol (NORMODYNE) 200  MG tablet Take 1 tablet (200 mg total) by mouth 2 (two) times daily. 08/18/20   Mirna Mires, CNM  naproxen (NAPROSYN) 250 MG tablet PLEASE SEE ATTACHED FOR DETAILED DIRECTIONS 08/28/20   [provider]  rivaroxaban (XARELTO) 20 MG TABS tablet Take 1 tablet (20 mg total) by mouth daily with supper. 09/21/20 10/21/20  Shirlee Latch, PA-C    Family History Family History  Problem Relation Age of Onset   Osteogenesis imperfecta Father        6 of patient's siblings also have osteogenesis imperfecta   Cancer Maternal Grandmother    Diabetes Paternal Grandmother    Osteogenesis imperfecta Niece        sister's daughter   Osteogenesis imperfecta Nephew        brother's son    Social History Social History   Tobacco  Use   Smoking status: Never Smoker   Smokeless tobacco: Never Used  Building services engineer Use: Never used  Substance Use Topics   Alcohol use: No   Drug use: No     Allergies   Patient has no known allergies.   Review of Systems Review of Systems  Constitutional: Negative for fatigue and fever.  Respiratory: Negative for cough and chest tightness.   Cardiovascular: Negative for chest pain.  Gastrointestinal: Negative for abdominal pain, nausea and vomiting.  Genitourinary: Negative for dysuria, flank pain, frequency, hematuria, urgency, vaginal bleeding, vaginal discharge and vaginal pain.       Foul smelling urine  Musculoskeletal: Negative for back pain.  Skin: Negative for rash.     Physical Exam Triage Vital Signs ED Triage Vitals  Enc Vitals Group     BP 09/21/20 1513 123/71     Pulse Rate 09/21/20 1513 78     Resp 09/21/20 1513 18     Temp 09/21/20 1513 98.6 F (37 C)     Temp Source 09/21/20 1513 Oral     SpO2 09/21/20 1513 100 %     Weight 09/21/20 1509 217 lb 9.5 oz (98.7 kg)     Height 09/21/20 1509 5\' 5"  (1.651 m)     Head Circumference --      Peak Flow --      Pain Score 09/21/20 1509 0     Pain Loc --      Pain Edu? --      Excl. in GC? --    No data found.  Updated Vital Signs BP 123/71 (BP Location: Right Arm)    Pulse 78    Temp 98.6 F (37 C) (Oral)    Resp 18    Ht 5\' 5"  (1.651 m)    Wt 217 lb 9.5 oz (98.7 kg)    SpO2 100%    Breastfeeding No    BMI 36.21 kg/m        Physical Exam Vitals and nursing note reviewed.  Constitutional:      General: She is not in acute distress.    Appearance: Normal appearance. She is obese. She is not ill-appearing or toxic-appearing.  HENT:     Head: Normocephalic and atraumatic.  Eyes:     General: No scleral icterus.       Right eye: No discharge.        Left eye: No discharge.     Conjunctiva/sclera: Conjunctivae normal.  Cardiovascular:     Rate and Rhythm: Normal rate and regular rhythm.       Heart sounds: Normal heart sounds.  Pulmonary:     Effort: Pulmonary effort is normal. No respiratory distress.     Breath sounds: Normal breath sounds.  Abdominal:     Palpations: Abdomen is soft.     Tenderness: There is no abdominal tenderness. There is no right CVA tenderness or left CVA tenderness.  Musculoskeletal:     Cervical back: Neck supple.  Skin:    General: Skin is dry.  Neurological:     General: No focal deficit present.     Mental Status: She is alert. Mental status is at baseline.     Motor: No weakness.     Gait: Gait normal.  Psychiatric:        Mood and Affect: Mood normal.        Behavior: Behavior normal.        Thought Content: Thought content normal.      UC Treatments / Results  Labs (all labs ordered are listed, but only abnormal results are displayed) Labs Reviewed  URINALYSIS, COMPLETE (UACMP) WITH MICROSCOPIC - Abnormal; Notable for the following components:      Result Value   APPearance HAZY (*)    Hgb urine dipstick TRACE (*)    Bilirubin Urine SMALL (*)    Ketones, ur TRACE (*)    Bacteria, UA FEW (*)    All other components within normal limits  URINE CULTURE    EKG   Radiology No results found.  Procedures Procedures (including critical care time)  Medications Ordered in UC Medications - No data to display  Initial Impression / Assessment and Plan / UC Course  I have reviewed the triage vital signs and the nursing notes.  Pertinent labs & imaging results that were available during my care of the patient were reviewed by me and considered in my medical decision making (see chart for details).    Urinalysis performed today shows trace blood, small bili, and trace ketones.  Specific gravity is 1.025.  Discussed results of urinalysis with patient and advised her urinalysis does not appear to be consistent with a UTI at this time.  Advised her that I will send the urine for culture and if it returns with bacterial growth will  treat at that time for UTI.  She does not have any urinary symptoms, but if some develop she should call our clinic back and we may prescribe something sooner.  I did prescribe patient 1 month supply of Xarelto after reviewing her ED records where she was seen for pulmonary embolism.  Advised her to follow-up with primary care and provided her with primary care information for [place] [facility] at [facility].   Final Clinical Impressions(s) / UC Diagnoses   Final diagnoses:  Abnormal urine odor  History of pulmonary embolism     Discharge Instructions     The urinalysis does not seem suggestive of a UTI at this time.  The urine could have an odor because it is very concentrated.  You should increase your fluid intake at this time.  We will send the urine for culture and if there is bacterial growth we will give you a call and start you on antibiotics.  If you develop any new symptoms you should be seen again.  I have refilled the Xarelto at this time.  Please contact primary care to set up an appointment as you will need to see primary care for more refills.  [place] [facility] at [facility], [address], [address]. Phone number:  [phone] Please call this number to establish with primary care    ED Prescriptions    Medication Sig Dispense Auth. Provider   rivaroxaban (XARELTO) 20 MG TABS tablet Take 1 tablet (20 mg total) by mouth daily with supper. 30 tablet Gareth Morgan     PDMP not reviewed this encounter.   Shirlee Latch, PA-C 09/22/20 [phone]

## 2020-09-21 NOTE — Discharge Instructions (Addendum)
The urinalysis does not seem suggestive of a UTI at this time.  The urine could have an odor because it is very concentrated.  You should increase your fluid intake at this time.  We will send the urine for culture and if there is bacterial growth we will give you a call and start you on antibiotics.  If you develop any new symptoms you should be seen again.  I have refilled the Xarelto at this time.  Please contact primary care to set up an appointment as you will need to see primary care for more refills.  [facility] at [facility], [address], [address]. Phone number: [phone] Please call this number to establish with primary care

## 2020-09-21 NOTE — ED Triage Notes (Signed)
Pt c/o odor to her urine. Started about a week ago. She states she recently had a UTI and treated. She had a baby about 5 weeks ago. She also states she was recently treated for a PE and wants to know if she can have her Xarelto refilled.

## 2020-09-22 LAB — URINE CULTURE: Culture: NO GROWTH

## 2020-09-26 NOTE — Telephone Encounter (Signed)
Pt wants afternoon appointment.  KP

## 2020-10-01 ENCOUNTER — Telehealth: Payer: Self-pay | Admitting: Advanced Practice Midwife

## 2020-10-01 ENCOUNTER — Ambulatory Visit: Payer: Medicaid Other | Admitting: Nurse Practitioner

## 2020-10-01 NOTE — Telephone Encounter (Signed)
Patient is scheduled for 10/03/20 with [facility] in [place] for Mirena placement / 6 week PP

## 2020-10-01 NOTE — Telephone Encounter (Signed)
Noted. Mirena available in [place]. 

## 2020-10-03 ENCOUNTER — Ambulatory Visit: Payer: Medicaid Other | Admitting: Advanced Practice Midwife

## 2020-10-09 DIAGNOSIS — Z029 Encounter for administrative examinations, unspecified: Secondary | ICD-10-CM

## 2020-10-18 ENCOUNTER — Encounter: Payer: Medicaid Other | Admitting: Certified Nurse Midwife

## 2020-10-18 ENCOUNTER — Telehealth: Payer: Medicaid Other | Admitting: Physician Assistant

## 2020-10-18 DIAGNOSIS — N898 Other specified noninflammatory disorders of vagina: Secondary | ICD-10-CM

## 2020-10-18 MED ORDER — METRONIDAZOLE 500 MG PO TABS: 500.0000 mg | ORAL_TABLET | Freq: Two times a day (BID) | ORAL | 0 refills | Status: AC

## 2020-10-18 NOTE — Progress Notes (Signed)
We are sorry that you are not feeling well. Here is how we plan to help!  Firstly, I am unable to prescribe a weight loss medication over an evisit. You will need to address this with your primary provider.   I can help with the vaginal discharge.  Based on what you shared with me it looks like you: May have a vaginosis due to bacteria  Vaginosis is an inflammation of the vagina that can result in discharge, itching and pain. The cause is usually a change in the normal balance of vaginal bacteria or an infection. Vaginosis can also result from reduced estrogen levels after menopause.  The most common causes of vaginosis are:   Bacterial vaginosis which results from an overgrowth of one on several organisms that are normally present in your vagina.   Yeast infections which are caused by a naturally occurring fungus called candida.   Vaginal atrophy (atrophic vaginosis) which results from the thinning of the vagina from reduced estrogen levels after menopause.   Trichomoniasis which is caused by a parasite and is commonly transmitted by sexual intercourse.  Factors that increase your risk of developing vaginosis include: Marland Kitchen Medications, such as antibiotics and steroids . Uncontrolled diabetes . Use of hygiene products such as bubble bath, vaginal spray or vaginal deodorant . Douching . Wearing damp or tight-fitting clothing . Using an intrauterine device (IUD) for birth control . Hormonal changes, such as those associated with pregnancy, birth control pills or menopause . Sexual activity . Having a sexually transmitted infection  Your treatment plan is Metronidazole or Flagyl 500mg  twice a day for 7 days.  I have electronically sent this prescription into the pharmacy that you have chosen.  Be sure to take all of the medication as directed. Stop taking any medication if you develop a rash, tongue swelling or shortness of breath. Mothers who are breast feeding should consider pumping and  discarding their breast milk while on these antibiotics. However, there is no consensus that infant exposure at these doses would be harmful.  Remember that medication creams can weaken latex condoms.   HOME CARE:  Good hygiene may prevent some types of vaginosis from recurring and may relieve some symptoms:  . Avoid baths, hot tubs and whirlpool spas. Rinse soap from your outer genital area after a shower, and dry the area well to prevent irritation. Don't use scented or harsh soaps, such as those with deodorant or antibacterial action. Marland Kitchen Avoid irritants. These include scented tampons and pads. . Wipe from front to back after using the toilet. Doing so avoids spreading fecal bacteria to your vagina.  Other things that may help prevent vaginosis include:  Marland Kitchen Don't douche. Your vagina doesn't require cleansing other than normal bathing. Repetitive douching disrupts the normal organisms that reside in the vagina and can actually increase your risk of vaginal infection. Douching won't clear up a vaginal infection. . Use a latex condom. Both female and female latex condoms may help you avoid infections spread by sexual contact. . Wear cotton underwear. Also wear pantyhose with a cotton crotch. If you feel comfortable without it, skip wearing underwear to bed. Yeast thrives in Marland Kitchen Your symptoms should improve in the next day or two.  GET HELP RIGHT AWAY IF:  . You have pain in your lower abdomen ( pelvic area or over your ovaries) . You develop nausea or vomiting . You develop a fever . Your discharge changes or worsens . You have persistent pain with  intercourse . You develop shortness of breath, a rapid pulse, or you faint.  These symptoms could be signs of problems or infections that need to be evaluated by a medical provider now.  MAKE SURE YOU    Understand these instructions.  Will watch your condition.  Will get help right away if you are not doing well or get  worse.  Your e-visit answers were reviewed by a board certified advanced clinical practitioner to complete your personal care plan. Depending upon the condition, your plan could have included both over the counter or prescription medications. Please review your pharmacy choice to make sure that you have choses a pharmacy that is open for you to pick up any needed prescription, Your safety is important to [place]. If you have drug allergies check your prescription carefully.   You can use MyChart to ask questions about today's visit, request a non-urgent call back, or ask for a work or school excuse for 24 hours related to this e-Visit. If it has been greater than 24 hours you will need to follow up with your provider, or enter a new e-Visit to address those concerns. You will get a MyChart message within the next two days asking about your experience. I hope that your e-visit has been valuable and will speed your recovery.  Approximately 5 minutes was spent documenting and reviewing patient's chart.

## 2020-10-21 ENCOUNTER — Other Ambulatory Visit: Payer: Self-pay | Admitting: Certified Nurse Midwife

## 2020-10-21 DIAGNOSIS — Z6836 Body mass index (BMI) 36.0-36.9, adult: Secondary | ICD-10-CM

## 2020-10-21 NOTE — Progress Notes (Signed)
Referral to [facility] per patient request, see orders.    Doris Thomas, CNM [facility], [facility] 10/21/20 3:09 PM

## 2020-10-28 ENCOUNTER — Ambulatory Visit: Payer: Medicaid Other | Admitting: Family Medicine

## 2020-11-06 DIAGNOSIS — Z419 Encounter for procedure for purposes other than remedying health state, unspecified: Secondary | ICD-10-CM | POA: Diagnosis not present

## 2020-11-07 ENCOUNTER — Encounter: Payer: Medicaid Other | Admitting: Certified Nurse Midwife

## 2020-11-13 ENCOUNTER — Encounter (INDEPENDENT_AMBULATORY_CARE_PROVIDER_SITE_OTHER): Payer: Self-pay

## 2020-11-25 ENCOUNTER — Other Ambulatory Visit: Payer: Self-pay

## 2020-11-25 ENCOUNTER — Encounter: Payer: Self-pay | Admitting: Certified Nurse Midwife

## 2020-11-25 ENCOUNTER — Ambulatory Visit (INDEPENDENT_AMBULATORY_CARE_PROVIDER_SITE_OTHER): Payer: Medicaid Other | Admitting: Certified Nurse Midwife

## 2020-11-25 VITALS — BP 126/96 | HR 96 | Ht 65.0 in | Wt 212.5 lb

## 2020-11-25 DIAGNOSIS — Z3009 Encounter for other general counseling and advice on contraception: Secondary | ICD-10-CM

## 2020-11-25 NOTE — Progress Notes (Signed)
Subjective:    Doris Thomas is a 24 y.o. female who presents for contraception counseling. The patient has no complaints today. The patient is sexually active. Pertinent past medical history: hypertension.  Menstrual History: OB History    Gravida  1   Para  1   Term  1   Preterm  0   AB  0   Living  1     SAB  0   IAB  0   Ectopic  0   Multiple  0   Live Births  1           Patient's last menstrual period was 11/18/2020 (exact date).    The following portions of the patient's history were reviewed and updated as appropriate: allergies, current medications, past family history, past medical history, past social history, past surgical history and problem list.  Review of Systems Pertinent items are noted in HPI.   Objective:     not indicated to discussed birth control     Assessment:    24 y.o., considering BC options. .   Plan:  Reviewed all forms of birth control options available including abstinence; fertility period awareness methods; over the counter/barrier methods; hormonal contraceptive medication including pill, patch, ring, injection,contraceptive implant; hormonal and nonhormonal IUDs; permanent sterilization options including vasectomy and the various tubal sterilization modalities. Risks and benefits reviewed.  Questions were answered.  She was considering the ring, discussed contraindication s due to hypertension. Recommend progestin only method. She is interested in IUD. Pamphlet given. Information was given to patient to review.   Follow up for IUD placement prn.    Doreene Burke, CNM

## 2020-11-25 NOTE — Patient Instructions (Signed)

## 2020-11-26 ENCOUNTER — Ambulatory Visit: Payer: Medicaid Other | Admitting: Family Medicine

## 2020-12-07 DIAGNOSIS — Z419 Encounter for procedure for purposes other than remedying health state, unspecified: Secondary | ICD-10-CM | POA: Diagnosis not present

## 2020-12-11 ENCOUNTER — Ambulatory Visit (INDEPENDENT_AMBULATORY_CARE_PROVIDER_SITE_OTHER): Payer: Medicaid Other | Admitting: Bariatrics

## 2020-12-25 ENCOUNTER — Ambulatory Visit (INDEPENDENT_AMBULATORY_CARE_PROVIDER_SITE_OTHER): Payer: Medicaid Other | Admitting: Bariatrics

## 2021-01-07 DIAGNOSIS — Z419 Encounter for procedure for purposes other than remedying health state, unspecified: Secondary | ICD-10-CM | POA: Diagnosis not present

## 2021-01-14 ENCOUNTER — Telehealth: Payer: Medicaid Other | Admitting: Physician Assistant

## 2021-01-14 DIAGNOSIS — Z8616 Personal history of COVID-19: Secondary | ICD-10-CM

## 2021-01-14 NOTE — Progress Notes (Signed)
Erroneous encounter. F2F disposition given as she wants paperwork completed which we do not do via e-visit.

## 2021-02-04 DIAGNOSIS — Z419 Encounter for procedure for purposes other than remedying health state, unspecified: Secondary | ICD-10-CM | POA: Diagnosis not present

## 2021-02-27 ENCOUNTER — Encounter: Payer: Self-pay | Admitting: Emergency Medicine

## 2021-02-27 ENCOUNTER — Telehealth: Payer: Medicaid Other | Admitting: Emergency Medicine

## 2021-02-27 DIAGNOSIS — S161XXA Strain of muscle, fascia and tendon at neck level, initial encounter: Secondary | ICD-10-CM | POA: Diagnosis not present

## 2021-02-27 DIAGNOSIS — R110 Nausea: Secondary | ICD-10-CM | POA: Diagnosis not present

## 2021-02-27 MED ORDER — CYCLOBENZAPRINE HCL 5 MG PO TABS: 5.0000 mg | ORAL_TABLET | Freq: Two times a day (BID) | ORAL | 0 refills | Status: DC | PRN

## 2021-02-27 MED ORDER — ONDANSETRON 4 MG PO TBDP: 4.0000 mg | ORAL_TABLET | Freq: Three times a day (TID) | ORAL | 0 refills | Status: DC | PRN

## 2021-02-27 MED ORDER — NAPROXEN 500 MG PO TABS: 500.0000 mg | ORAL_TABLET | Freq: Two times a day (BID) | ORAL | 0 refills | Status: DC

## 2021-02-27 MED ORDER — CYCLOBENZAPRINE HCL 5 MG PO TABS
5.0000 mg | ORAL_TABLET | Freq: Two times a day (BID) | ORAL | 0 refills | Status: DC | PRN
Start: 2021-02-27 — End: 2022-02-02

## 2021-02-27 NOTE — Progress Notes (Deleted)
Duplicate

## 2021-02-27 NOTE — Progress Notes (Addendum)
Doris Thomas, Doris Thomas are scheduled for a virtual visit with your provider today.    Just as we do with appointments in the office, we must obtain your consent to participate.  Your consent will be active for this visit and any virtual visit you may have with one of our providers in the next 365 days.    If you have a MyChart account, I can also send a copy of this consent to you electronically.  All virtual visits are billed to your insurance company just like a traditional visit in the office.  As this is a virtual visit, video technology does not allow for your provider to perform a traditional examination.  This may limit your provider's ability to fully assess your condition.  If your provider identifies any concerns that need to be evaluated in person or the need to arrange testing such as labs, EKG, etc, we will make arrangements to do so.    Although advances in technology are sophisticated, we cannot ensure that it will always work on either your end or our end.  If the connection with a video visit is poor, we may have to switch to a telephone visit.  With either a video or telephone visit, we are not always able to ensure that we have a secure connection.   I need to obtain your verbal consent now.   Are you willing to proceed with your visit today?   Doris Thomas has provided verbal consent on 02/27/2021 for a virtual visit (video or telephone).   Doris Shadow, PA-C 02/27/2021  1:07 PM   Date:  02/27/2021   ID:  Doris Thomas, DOB 1996/01/09, MRN [id number]  Patient Location: Home Provider Location: Home Office   Participants: Patient and Provider for Visit and Wrap up  Method of visit: Video  Location of Patient: Home Location of Provider: Home Office Consent was obtain for visit over the video. Services rendered by provider: Visit was performed via video  A video enabled telemedicine application was used and I verified that I am speaking with the correct person using two  identifiers.  PCP:  Larae Grooms, NP   Chief Complaint:  Right lower neck pain  History of Present Illness:    Doris Thomas is a 25 y.o. female with history as stated below. Presents video telehealth for an acute care visit  Onset of symptoms was yesterday and symptoms have been persistent and include: mild swelling, preceded by 1 week of mild tightness and soreness, 1/10 severity.  Pain does not radiate.   Denies having fevers, chills, headache, dizziness, numbness or weakness in arms or legs, shortness of breath, cough, chest pain, ear pain, sore throat or exposure to covid or other sick contacts.   Modifying factors include: certain movements of right shoulder make pain worse. Rest makes it better.  No other aggravating or relieving factors.    Pt also reports 2 days of mild nausea after eating. Denies vomiting or diarrhea. Denies abdominal pain, back pain or urinary symptoms. No sick contacts. LMP ended 2 days ago. Denies concern for pregnancy. Pt thinks she may have eaten something bad.   She does report hx of PE last year, treated with 2 months of Xarelto and now takes aspirin 81mg  daily. She recently changed PCP and plans to schedule an in-office visit soon but no appointments were available this week.  Neck discomfort does not feel like when she had the PE. Pt reports SOB and severe side/back pain  with her PE.   The patient does not have symptoms concerning for COVID-19 infection (fever, chills, cough, or new shortness of breath).  Patient not been tested for COVID during this illness.  Past Medical, Surgical, Social History, Allergies, and Medications have been Reviewed.  Patient Active Problem List   Diagnosis Date Noted  . Neck strain, initial encounter 02/27/2021  . Nausea without vomiting 02/27/2021  . Pulmonary embolism (HCC) 08/29/2020  . Elevated blood pressure reading 08/29/2020  . Anemia 08/29/2020  . Family history of osteogenesis imperfecta 01/04/2020  .  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 Vitamin D deficiency 10/18/2017    Social History   Tobacco Use  . Smoking status: Never Smoker  . Smokeless tobacco: Never Used  Substance Use Topics  . Alcohol use: No     Current Outpatient Medications:  .  cyclobenzaprine (FLEXERIL) 5 MG tablet, Take 1-2 tablets (5-10 mg total) by mouth 2 (two) times daily as needed for muscle spasms., Disp: 15 tablet, Rfl: 0 .  naproxen (NAPROSYN) 500 MG tablet, Take 1 tablet (500 mg total) by mouth 2 (two) times daily with a meal., Disp: 14 tablet, Rfl: 0 .  ondansetron (ZOFRAN-ODT) 4 MG disintegrating tablet, Take 1 tablet (4 mg total) by mouth every 8 (eight) hours as needed for nausea or vomiting., Disp: 12 tablet, Rfl: 0   No Known Allergies   Review of Systems  Constitutional: Negative for chills, diaphoresis, fever and malaise/fatigue.  Respiratory: Negative for cough and shortness of breath.   Cardiovascular: Negative for chest pain, palpitations and leg swelling.  Gastrointestinal: Positive for nausea. Negative for abdominal pain, diarrhea and vomiting.  Genitourinary: Negative for flank pain, frequency and urgency.  Musculoskeletal: Positive for myalgias and neck pain. Negative for back pain.  Skin: Negative for rash.  Neurological: Negative for dizziness, tingling, sensory change, weakness and headaches.   See HPI for history of present illness.  Physical Exam Vitals and nursing note reviewed.  Constitutional:      General: She is not in acute distress.    Appearance: Normal appearance. She is well-developed. She is not ill-appearing, toxic-appearing or diaphoretic.  HENT:     Head: Normocephalic and atraumatic.  Pulmonary:     Effort: Pulmonary effort is normal. No respiratory distress.  Musculoskeletal:        General: Normal range of motion.     Cervical back: Normal range of motion. Tenderness (Right lower side per pt palpation, minimal) present. No rigidity.      Comments: Full ROM upper extremities without difficulty.  No obvious deformity noted of neck or shoulders in 2D video visit format.  Skin:    General: Skin is warm and dry.  Neurological:     Mental Status: She is alert and oriented to person, place, and time.  Psychiatric:        Mood and Affect: Mood normal.        Behavior: Behavior normal.               A&P  1. Right side neck strain  Flexeril, caution when driving, operating heavy machinery and caring for her young child(ren)   -Naproxen: take with food. 2. Nausea, without vomiting  Ondansetron  Advised to schedule f/u with PCP in-person soon as planned.  Advised to go to emergency department or urgent care if symptoms worsening or not improving by the end of the week.     Patient voiced understanding and agreement to plan.   Time:  Today, I have spent 15 minutes with the patient with telehealth technology discussing the above problems, reviewing the chart, previous notes, medications and orders.    Tests Ordered: No orders of the defined types were placed in this encounter.   Medication Changes: Meds ordered this encounter  Medications  . DISCONTD: cyclobenzaprine (FLEXERIL) 5 MG tablet    Sig: Take 1-2 tablets (5-10 mg total) by mouth 2 (two) times daily as needed for muscle spasms.    Dispense:  15 tablet    Refill:  0  . DISCONTD: ondansetron (ZOFRAN-ODT) 4 MG disintegrating tablet    Sig: Take 1 tablet (4 mg total) by mouth every 8 (eight) hours as needed for nausea or vomiting.    Dispense:  12 tablet    Refill:  0  . DISCONTD: naproxen (NAPROSYN) 500 MG tablet    Sig: Take 1 tablet (500 mg total) by mouth 2 (two) times daily with a meal.    Dispense:  14 tablet    Refill:  0  . cyclobenzaprine (FLEXERIL) 5 MG tablet    Sig: Take 1-2 tablets (5-10 mg total) by mouth 2 (two) times daily as needed for muscle spasms.    Dispense:  15 tablet    Refill:  0  . naproxen (NAPROSYN) 500 MG tablet    Sig: Take  1 tablet (500 mg total) by mouth 2 (two) times daily with a meal.    Dispense:  14 tablet    Refill:  0  . DISCONTD: ondansetron (ZOFRAN-ODT) 4 MG disintegrating tablet    Sig: Take 1 tablet (4 mg total) by mouth every 8 (eight) hours as needed for nausea or vomiting.    Dispense:  12 tablet    Refill:  0  . ondansetron (ZOFRAN-ODT) 4 MG disintegrating tablet    Sig: Take 1 tablet (4 mg total) by mouth every 8 (eight) hours as needed for nausea or vomiting.    Dispense:  12 tablet    Refill:  0     Disposition:  Follow up Advised to schedule f/u with PCP in-person soon as planned.  Advised to go to emergency department or urgent care if symptoms worsening or not improving by the end of the week.

## 2021-03-07 DIAGNOSIS — Z419 Encounter for procedure for purposes other than remedying health state, unspecified: Secondary | ICD-10-CM | POA: Diagnosis not present

## 2021-03-11 DIAGNOSIS — Z0000 Encounter for general adult medical examination without abnormal findings: Secondary | ICD-10-CM | POA: Diagnosis not present

## 2021-03-11 DIAGNOSIS — Z713 Dietary counseling and surveillance: Secondary | ICD-10-CM | POA: Diagnosis not present

## 2021-03-11 DIAGNOSIS — Z6837 Body mass index (BMI) 37.0-37.9, adult: Secondary | ICD-10-CM | POA: Diagnosis not present

## 2021-03-11 DIAGNOSIS — E559 Vitamin D deficiency, unspecified: Secondary | ICD-10-CM | POA: Diagnosis not present

## 2021-03-11 DIAGNOSIS — E669 Obesity, unspecified: Secondary | ICD-10-CM | POA: Diagnosis not present

## 2021-04-06 DIAGNOSIS — Z419 Encounter for procedure for purposes other than remedying health state, unspecified: Secondary | ICD-10-CM | POA: Diagnosis not present

## 2021-05-07 DIAGNOSIS — Z419 Encounter for procedure for purposes other than remedying health state, unspecified: Secondary | ICD-10-CM | POA: Diagnosis not present

## 2021-05-24 ENCOUNTER — Telehealth: Payer: Medicaid Other | Admitting: Nurse Practitioner

## 2021-05-24 ENCOUNTER — Encounter: Payer: Self-pay | Admitting: Nurse Practitioner

## 2021-05-24 DIAGNOSIS — N760 Acute vaginitis: Secondary | ICD-10-CM

## 2021-05-24 DIAGNOSIS — B9689 Other specified bacterial agents as the cause of diseases classified elsewhere: Secondary | ICD-10-CM

## 2021-05-24 MED ORDER — METRONIDAZOLE 500 MG PO TABS: 500.0000 mg | ORAL_TABLET | Freq: Two times a day (BID) | ORAL | 0 refills | Status: DC

## 2021-05-24 NOTE — Progress Notes (Signed)
Virtual Visit Consent   [name], you are scheduled for a virtual visit with Mary-Margaret Daphine Deutscher, FNP, a [facility] provider, today.     Just as with appointments in the office, your consent must be obtained to participate.  Your consent will be active for this visit and any virtual visit you may have with one of our providers in the next 365 days.     If you have a MyChart account, a copy of this consent can be sent to you electronically.  All virtual visits are billed to your insurance company just like a traditional visit in the office.    As this is a virtual visit, video technology does not allow for your provider to perform a traditional examination.  This may limit your provider's ability to fully assess your condition.  If your provider identifies any concerns that need to be evaluated in person or the need to arrange testing (such as labs, EKG, etc.), we will make arrangements to do so.     Although advances in technology are sophisticated, we cannot ensure that it will always work on either your end or our end.  If the connection with a video visit is poor, the visit may have to be switched to a telephone visit.  With either a video or telephone visit, we are not always able to ensure that we have a secure connection.     I need to obtain your verbal consent now.   Are you willing to proceed with your visit today? YES   Doris Thomas has provided verbal consent on 05/24/2021 for a virtual visit (video or telephone).   Mary-Margaret Daphine Deutscher, FNP   Date: 05/24/2021 3:33 PM   Virtual Visit via Video Note   I, Mary-Margaret Daphine Deutscher, connected withNAME@ ([id number], 07-09-1996) on 05/24/21 at  3:30 PM EDT by a video-enabled telemedicine application and verified that I am speaking with the correct person using two identifiers.  Location: Patient: Virtual Visit Location Patient: Mobile Provider: Virtual Visit Location Provider: Mobile   I discussed the limitations of evaluation  and management by telemedicine and the availability of in person appointments. The patient expressed understanding and agreed to proceed.    History of Present Illness: Doris Thomas is a 25 y.o. who identifies as a female who was assigned female at birth, and is being seen today for vaginosis.  HPI: HPI  Patient states that she has bacterial vaginosis. Fishy odor with vaginal discharge. Started a couple of days ago.  Review of Systems  Constitutional: Negative.   Respiratory: Negative.    Cardiovascular: Negative.   Genitourinary:  Negative for dysuria, frequency, hematuria and urgency.  Neurological: Negative.   Psychiatric/Behavioral: Negative.     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   Vitamin D deficiency 10/18/2017    Allergies: No Known Allergies Medications:  Current Outpatient Medications:    cyclobenzaprine (FLEXERIL) 5 MG tablet, Take 1-2 tablets (5-10 mg total) by mouth 2 (two) times daily as needed for muscle spasms., Disp: 15 tablet, Rfl: 0   naproxen (NAPROSYN) 500 MG tablet, Take 1 tablet (500 mg total) by mouth 2 (two) times daily with a meal., Disp: 14 tablet, Rfl: 0  ondansetron (ZOFRAN-ODT) 4 MG disintegrating tablet, Take 1 tablet (4 mg total) by mouth every 8 (eight) hours as needed for nausea or vomiting., Disp: 12 tablet, Rfl: 0  Observations/Objective: Patient is well-developed, well-nourished in no acute distress.  Resting comfortably  at home.  Head is normocephalic, atraumatic.  No labored breathing.  Speech is clear and coherent with logical content.  Patient is alert and oriented at baseline.    Assessment and Plan: [name]  in today with chief complaint of No chief complaint on file.   1. Bacterial vaginosis Neka A Hipolito in today with chief complaint of No chief complaint on file.   1. Bacterial vaginosis No bubble baths No douching  Meds ordered this encounter  Medications   metroNIDAZOLE (FLAGYL) 500 MG tablet    Sig: Take 1 tablet (500 mg total) by mouth 2 (two) times daily.    Dispense:  14 tablet    Refill:  0    Order Specific Question:   Supervising Provider    Answer:   Eber Hong [[id number]]           Follow Up Instructions: I discussed the assessment and treatment plan with the patient. The patient was provided an opportunity to ask questions and all were answered. The patient agreed with the plan and demonstrated an understanding of the instructions.  A copy of instructions were sent to the patient via MyChart.  The patient was advised to call back or seek an in-person evaluation if the symptoms worsen or if the condition fails to improve as anticipated.  Time:  I spent 10 minutes with the patient via telehealth technology discussing the above problems/concerns.    Mary-Margaret Daphine Deutscher, FNP

## 2021-06-06 DIAGNOSIS — Z419 Encounter for procedure for purposes other than remedying health state, unspecified: Secondary | ICD-10-CM | POA: Diagnosis not present

## 2021-07-07 DIAGNOSIS — Z419 Encounter for procedure for purposes other than remedying health state, unspecified: Secondary | ICD-10-CM | POA: Diagnosis not present

## 2021-08-07 DIAGNOSIS — Z419 Encounter for procedure for purposes other than remedying health state, unspecified: Secondary | ICD-10-CM | POA: Diagnosis not present

## 2021-09-06 DIAGNOSIS — Z419 Encounter for procedure for purposes other than remedying health state, unspecified: Secondary | ICD-10-CM | POA: Diagnosis not present

## 2021-10-03 NOTE — Telephone Encounter (Signed)
No Show 10/03/20 appointment

## 2021-10-07 DIAGNOSIS — Z419 Encounter for procedure for purposes other than remedying health state, unspecified: Secondary | ICD-10-CM | POA: Diagnosis not present

## 2021-11-06 DIAGNOSIS — Z419 Encounter for procedure for purposes other than remedying health state, unspecified: Secondary | ICD-10-CM | POA: Diagnosis not present

## 2021-12-07 DIAGNOSIS — Z419 Encounter for procedure for purposes other than remedying health state, unspecified: Secondary | ICD-10-CM | POA: Diagnosis not present

## 2021-12-19 DIAGNOSIS — E6609 Other obesity due to excess calories: Secondary | ICD-10-CM | POA: Diagnosis not present

## 2021-12-19 DIAGNOSIS — E559 Vitamin D deficiency, unspecified: Secondary | ICD-10-CM | POA: Diagnosis not present

## 2021-12-19 DIAGNOSIS — Z713 Dietary counseling and surveillance: Secondary | ICD-10-CM | POA: Diagnosis not present

## 2021-12-19 DIAGNOSIS — Z6836 Body mass index (BMI) 36.0-36.9, adult: Secondary | ICD-10-CM | POA: Diagnosis not present

## 2021-12-19 DIAGNOSIS — E871 Hypo-osmolality and hyponatremia: Secondary | ICD-10-CM | POA: Diagnosis not present

## 2022-01-07 DIAGNOSIS — Z419 Encounter for procedure for purposes other than remedying health state, unspecified: Secondary | ICD-10-CM | POA: Diagnosis not present

## 2022-01-13 DIAGNOSIS — J029 Acute pharyngitis, unspecified: Secondary | ICD-10-CM | POA: Diagnosis not present

## 2022-01-13 DIAGNOSIS — R0981 Nasal congestion: Secondary | ICD-10-CM | POA: Diagnosis not present

## 2022-01-13 DIAGNOSIS — R059 Cough, unspecified: Secondary | ICD-10-CM | POA: Diagnosis not present

## 2022-01-13 DIAGNOSIS — R0989 Other specified symptoms and signs involving the circulatory and respiratory systems: Secondary | ICD-10-CM | POA: Diagnosis not present

## 2022-02-02 ENCOUNTER — Ambulatory Visit (INDEPENDENT_AMBULATORY_CARE_PROVIDER_SITE_OTHER): Payer: Medicaid Other | Admitting: Obstetrics

## 2022-02-02 ENCOUNTER — Encounter: Payer: Self-pay | Admitting: Obstetrics

## 2022-02-02 ENCOUNTER — Other Ambulatory Visit: Payer: Self-pay

## 2022-02-02 ENCOUNTER — Other Ambulatory Visit (HOSPITAL_COMMUNITY)
Admission: RE | Admit: 2022-02-02 | Discharge: 2022-02-02 | Disposition: A | Discharge status: Home / Self Care | Payer: Medicaid Other | Source: Ambulatory Visit | Attending: Obstetrics | Admitting: Obstetrics

## 2022-02-02 VITALS — BP 126/84 | Ht 65.0 in | Wt 217.0 lb

## 2022-02-02 DIAGNOSIS — Z30011 Encounter for initial prescription of contraceptive pills: Secondary | ICD-10-CM | POA: Diagnosis not present

## 2022-02-02 DIAGNOSIS — N939 Abnormal uterine and vaginal bleeding, unspecified: Secondary | ICD-10-CM

## 2022-02-02 MED ORDER — NORETHINDRONE 0.35 MG PO TABS: 1.0000 | ORAL_TABLET | Freq: Every day | ORAL | 3 refills | Status: DC

## 2022-02-02 NOTE — Progress Notes (Signed)
Obstetrics & Gynecology Office Visit   Chief Complaint:  Chief Complaint  Patient presents with   Menstrual Problem    History of Present Illness: Doris Thomas presents with concerns about having two bleding episodes this month. Her LMP was 01/22/2022 which lasted for about 6 days. She then noticed som light spotting which is now dark red. 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 She is partnered and had a baby about 18 months ago. She is sexually active, and using condoms, but no hormonal birth control. She shares that after her delivery she was diagnosed with a DVT and remained on blood thinners for awhile after this was addressed medically. She has a PCP at [facility] and saw that provider several months ago, but did not decide on any other contraceptive method. She is no longer using any blood thinner meds. She has thought about birth control, but not implemented any hormonal plan.    Review of Systems:  Review of Systems  Constitutional: Negative.   HENT: Negative.    Eyes: Negative.   Respiratory: Negative.    Cardiovascular: Negative.        Hx of DVT in 2021  Gastrointestinal: Negative.   Genitourinary: Negative.   Musculoskeletal: Negative.   Skin: Negative.   Neurological: Negative.   Endo/Heme/Allergies: Negative.   Psychiatric/Behavioral: Negative.      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  The patient herself was tested for the particular type of osteogenesis and tested negative   Pulmonary embolism (HCC)    Vitamin D deficiency     Past Surgical History:  Past Surgical History:  Procedure Laterality Date   NO PAST SURGERIES      Gynecologic History: Patient's last menstrual period was 01/16/2022 (exact date).  Obstetric History: G1P1001  Family History:  Family History  Problem Relation Age of Onset   Osteogenesis  imperfecta Father        6 of patient's siblings also have osteogenesis imperfecta   Cancer Maternal Grandmother    Diabetes Paternal Grandmother    Osteogenesis imperfecta Niece        sister's daughter   Osteogenesis imperfecta Nephew        brother's son    Social History:  Social History   Socioeconomic History   Marital status: Single    Spouse name: Ivar Drape   Number of children: Not on file   Years of education: Not on file   Highest education level: Not on file  Occupational History   Not on file  Tobacco Use   Smoking status: Never   Smokeless tobacco: Never  Vaping Use   Vaping Use: Never used  Substance and Sexual Activity   Alcohol use: No   Drug use: No   Sexual activity: Yes    Birth control/protection: None  Other Topics Concern   Not on file  Social History Narrative   Not on file   Social Determinants of Health   Financial Resource Strain: Not on file  Food Insecurity: Not on file  Transportation Needs: Not on file  Physical Activity: Not on file  Stress: Not on file  Social Connections: Not on file  Intimate Partner Violence: Not on file    Allergies:  No Known Allergies  Medications: Prior to Admission medications   Not on File    Physical Exam Vitals:  Vitals:  02/02/22 1441  BP: 126/84   Patient's last menstrual period was 01/16/2022 (exact date).  Physical Exam Constitutional:      Appearance: Normal appearance. She is obese.  HENT:     Head: Normocephalic and atraumatic.  Cardiovascular:     Rate and Rhythm: Normal rate and regular rhythm.  Pulmonary:     Effort: Pulmonary effort is normal.     Breath sounds: Normal breath sounds.  Genitourinary:    General: Normal vulva.     Vagina: Vaginal discharge present.     Rectum: Normal.     Comments: Small amount of red blood noted in vaginal vault. Musculoskeletal:        General: Normal range of motion.  Neurological:     General: No focal deficit present.     Mental  Status: She is alert and oriented to person, place, and time.  Psychiatric:        Mood and Affect: Mood normal.        Behavior: Behavior normal.     Assessment: 26 y.o. G1P1001 irregular bleeding  this month  (AUB) of unknown etiology Desires contraceptive plan.   Plan: Problem List Items Addressed This Visit   None Visit Diagnoses     Abnormal uterine bleeding (AUB)    -  Primary   Relevant Orders   Cervicovaginal ancillary only     Will review Aptima swab results to rule out infection. Discussed contraception today- She is interested in using the mini pill and this is prescribed for her. Advsied her NOT to use estrogen Contraception due to her hx of DVT. Her pregnancy test is negative today. She is encouraged to use an APP to record her menstrual cycles, and will f/u P{RN should the AUB continue.

## 2022-02-04 DIAGNOSIS — Z419 Encounter for procedure for purposes other than remedying health state, unspecified: Secondary | ICD-10-CM | POA: Diagnosis not present

## 2022-02-05 LAB — CERVICOVAGINAL ANCILLARY ONLY
Bacterial Vaginitis (gardnerella): NEGATIVE
Chlamydia: NEGATIVE
Comment: NEGATIVE
Comment: NEGATIVE
Comment: NEGATIVE
Comment: NORMAL
Neisseria Gonorrhea: NEGATIVE
Trichomonas: NEGATIVE

## 2022-02-11 ENCOUNTER — Encounter: Payer: Self-pay | Admitting: Obstetrics

## 2022-03-07 DIAGNOSIS — Z419 Encounter for procedure for purposes other than remedying health state, unspecified: Secondary | ICD-10-CM | POA: Diagnosis not present

## 2022-03-09 IMAGING — US US EXTREM LOW VENOUS*R*
1 series · 14 of 24 positions shown · non-contrast
Comparison: None.

CLINICAL DATA: Pulmonary embolism, right leg swelling

EXAM:
RIGHT LOWER EXTREMITY VENOUS DOPPLER ULTRASOUND
TECHNIQUE: Gray-scale sonography with compression, as well as color and duplex
ultrasound, were performed to evaluate the deep venous system(s)
from the level of the common femoral vein through the popliteal and
proximal calf veins.

[Series 1: us venous img lower uni right (dvt) · portal-venous · 14 of 33 slices shown]
[im 1/33]
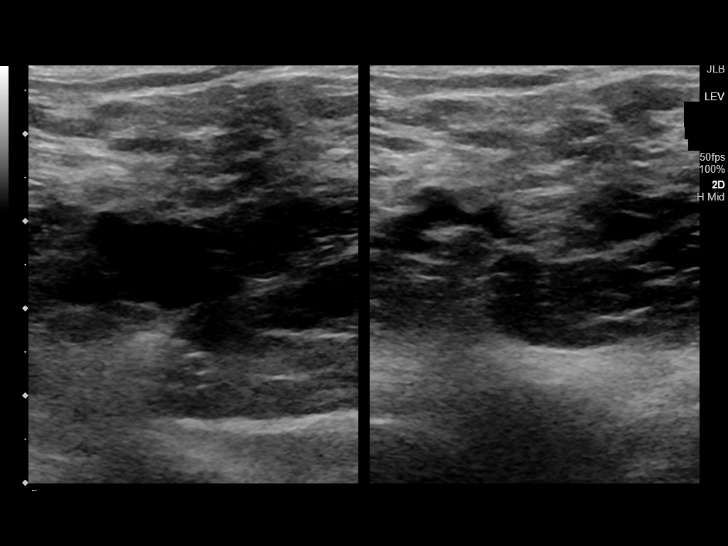
[im 3/33]
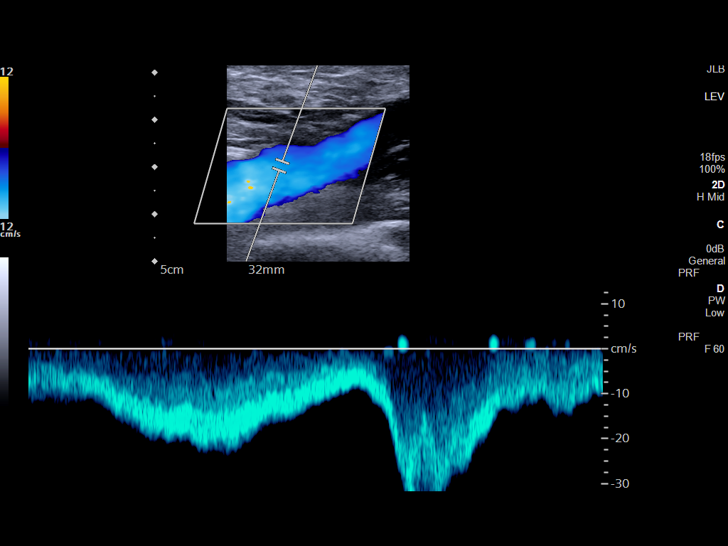
[im 6/33]
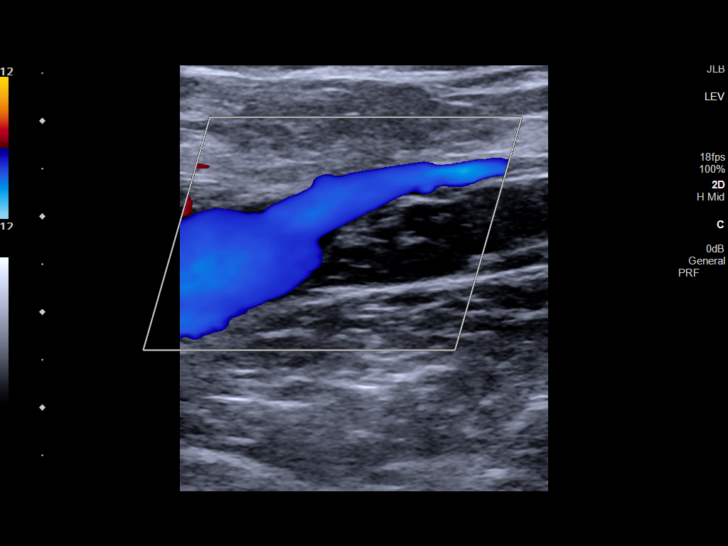
[im 9/33]
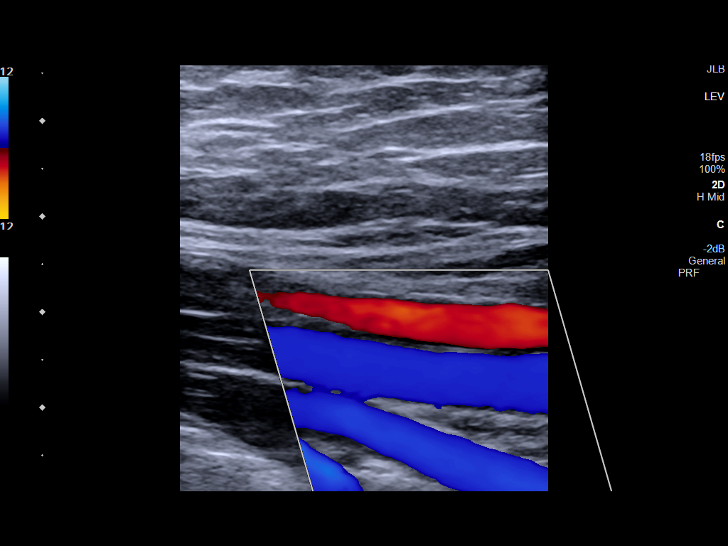
[im 10/33]
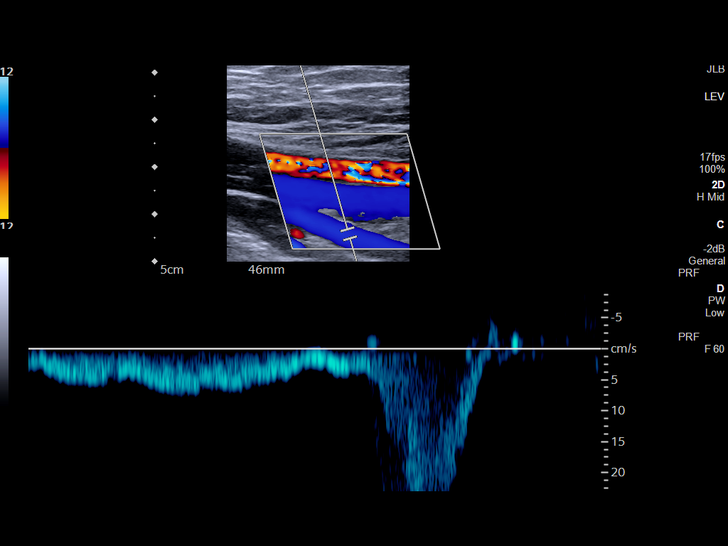
[im 13/33]
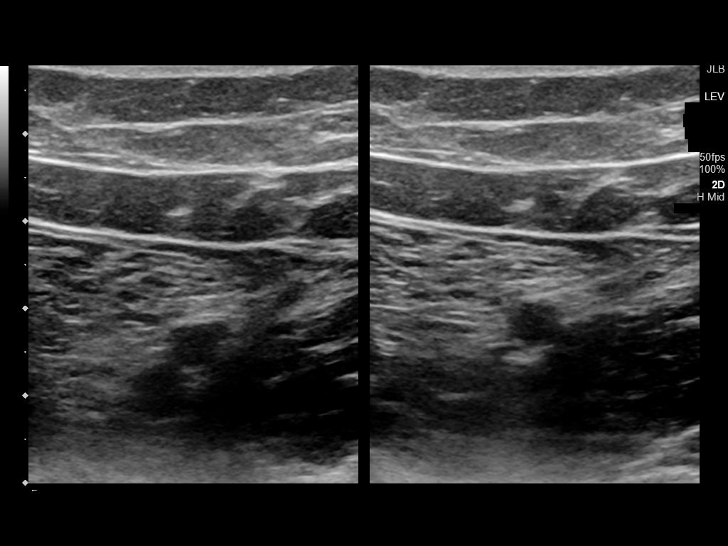
[im 16/33]
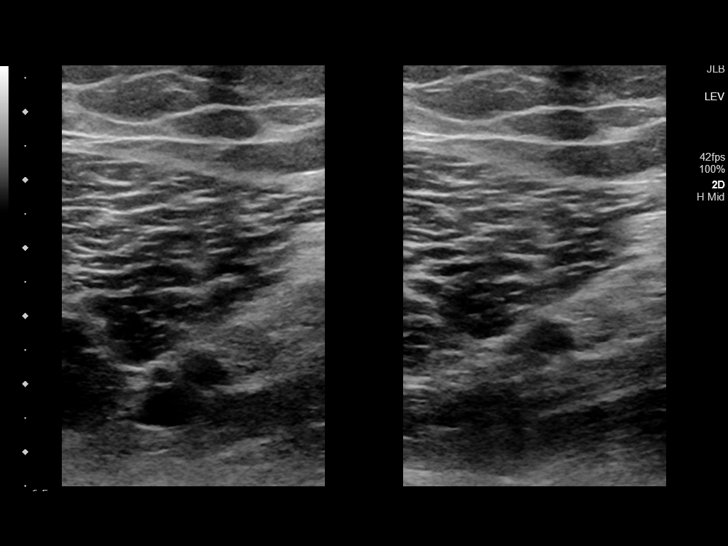
[im 17/33]
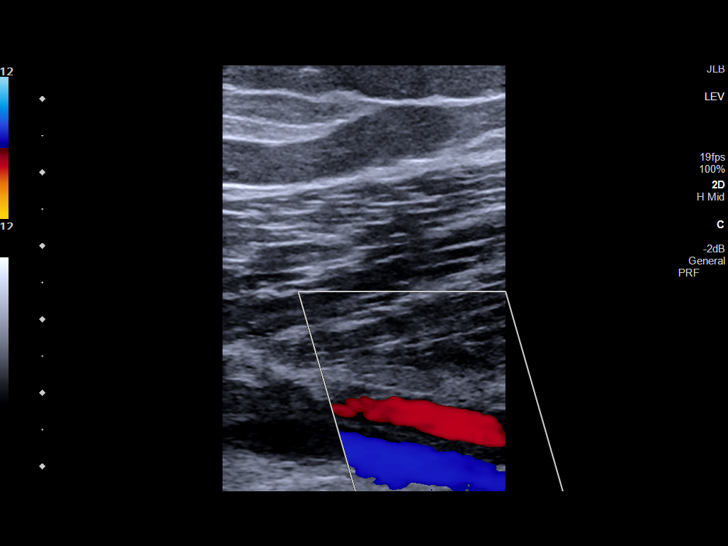
[im 20/33]
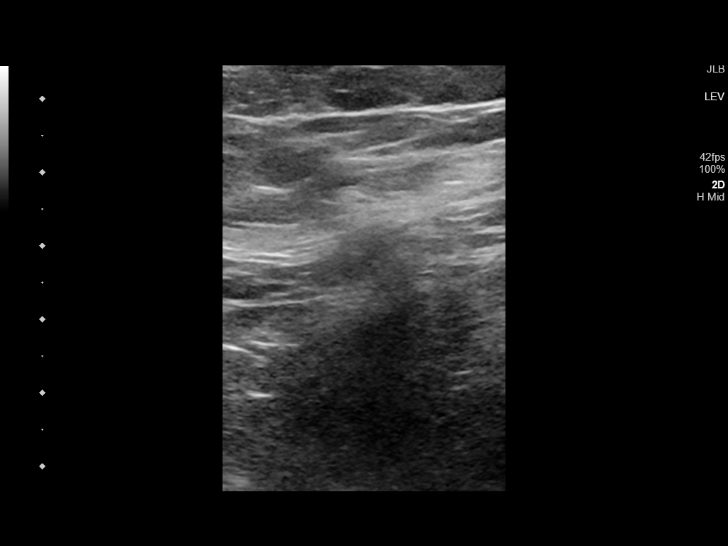
[im 23/33]
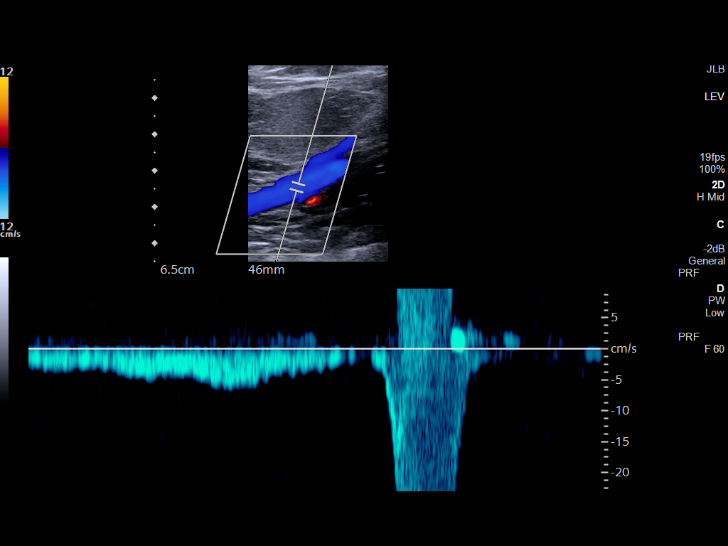
[im 26/33]
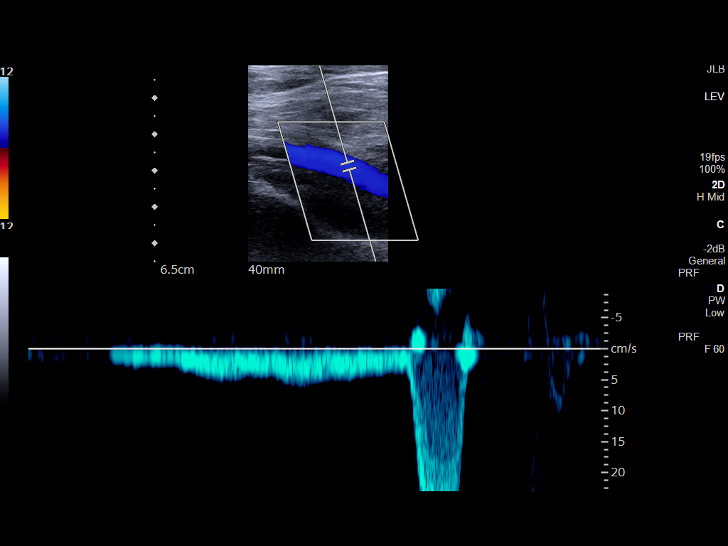
[im 27/33]
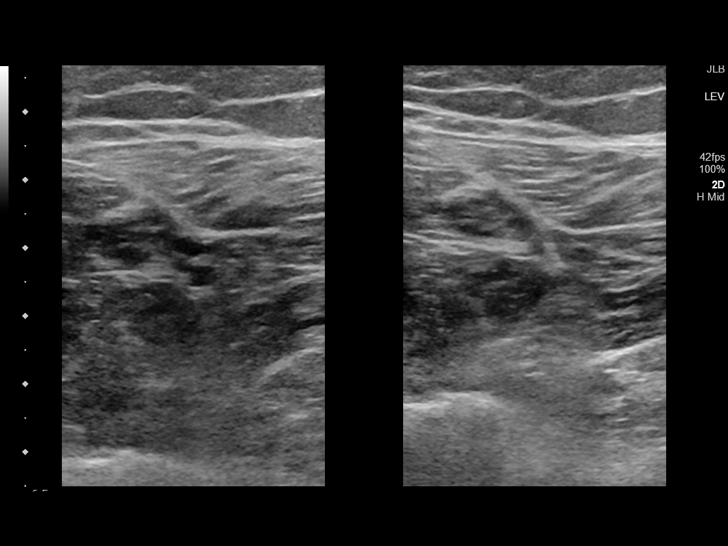
[im 30/33]
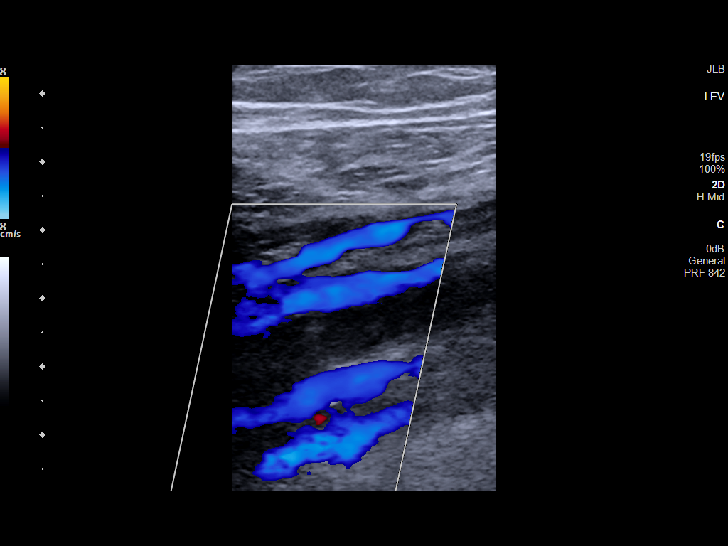
[im 33/33]
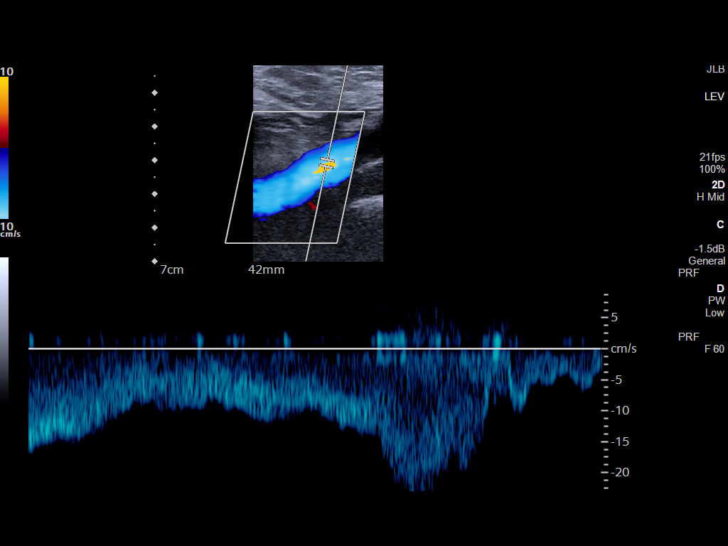

[14 of 24 positions shown; findings below may reference images not displayed]

FINDINGS: VENOUS

Normal compressibility of the common femoral, superficial femoral,
and popliteal veins, as well as the visualized calf veins.
Visualized portions of profunda femoral vein and great saphenous
vein unremarkable. No filling defects to suggest DVT on grayscale or
color Doppler imaging. Doppler waveforms show normal direction of
venous flow, normal respiratory plasticity and response to
augmentation.

Limited views of the contralateral common femoral vein are
unremarkable.

OTHER

None.

Limitations: none
IMPRESSION: Negative.

## 2022-03-20 ENCOUNTER — Telehealth: Payer: Medicaid Other | Admitting: Emergency Medicine

## 2022-03-20 DIAGNOSIS — J020 Streptococcal pharyngitis: Secondary | ICD-10-CM | POA: Diagnosis not present

## 2022-03-20 MED ORDER — AMOXICILLIN 500 MG PO CAPS: 500.0000 mg | ORAL_CAPSULE | Freq: Three times a day (TID) | ORAL | 0 refills | Status: DC

## 2022-03-20 NOTE — Progress Notes (Signed)
?Virtual Visit Consent  ? ?Doris Thomas, you are scheduled for a virtual visit with a [facility] provider today.   ?  ?Just as with appointments in the office, your consent must be obtained to participate.  Your consent will be active for this visit and any virtual visit you may have with one of our providers in the next 365 days.   ?  ?If you have a MyChart account, a copy of this consent can be sent to you electronically.  All virtual visits are billed to your insurance company just like a traditional visit in the office.   ? ?As this is a virtual visit, video technology does not allow for your provider to perform a traditional examination.  This may limit your provider's ability to fully assess your condition.  If your provider identifies any concerns that need to be evaluated in person or the need to arrange testing (such as labs, EKG, etc.), we will make arrangements to do so.   ?  ?Although advances in technology are sophisticated, we cannot ensure that it will always work on either your end or our end.  If the connection with a video visit is poor, the visit may have to be switched to a telephone visit.  With either a video or telephone visit, we are not always able to ensure that we have a secure connection.    ? ?I need to obtain your verbal consent now.   Are you willing to proceed with your visit today?  ?  ?Doris Thomas has provided verbal consent on 03/20/2022 for a virtual visit (video or telephone). ?  ?Doris Circle, PA-C  ? ?Date: 03/20/2022 10:28 AM ? ? ?Virtual Visit via Video Note  ? ?IMontine Thomas, connected with  Doris Thomas  ([id number], 1996/06/05) on 03/20/22 at 10:30 AM EDT by a video-enabled telemedicine application and verified that I am speaking with the correct person using two identifiers. ? ?Location: ?Patient: Virtual Visit Location Patient: Home ?Provider: Virtual Visit Location Provider: Home Office ?  ?I discussed the limitations of evaluation and management by  telemedicine and the availability of in person appointments. The patient expressed understanding and agreed to proceed.   ? ?History of Present Illness: ?Doris Thomas is a 26 y.o. who identifies as a female who was assigned female at birth, and is being seen today for sore throat.  She states that she is having some white spots on the back of her throat.  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  Denies measured fever.  Denies cough. ? ?HPI: HPI  ?Problems:  ?Patient Active Problem List  ? Diagnosis Date Noted  ? Neck strain, initial encounter 02/27/2021  ? Nausea without vomiting 02/27/2021  ? Pulmonary embolism ([place]) 08/29/2020  ? Elevated blood pressure reading 08/29/2020  ? Anemia 08/29/2020  ? Family history of osteogenesis imperfecta 01/04/2020  ? Vitamin D deficiency 10/18/2017  ?  ?Allergies: No Known Allergies ?Medications:  ?Current Outpatient Medications:  ?  norethindrone (ORTHO MICRONOR) 0.35 MG tablet, Take 1 tablet (0.35 mg total) by mouth daily., Disp: 84 tablet, Rfl: 3 ? ?Observations/Objective: ?Patient is well-developed, well-nourished in no acute distress.  ?Resting comfortably  at home.  ?Head is normocephalic, atraumatic.  ?No labored breathing.  ?Speech is clear and coherent with logical content.  ?Patient is alert and oriented at baseline.  ?Normal voice ? ?Assessment and Plan: ?1. Pharyngitis due to Streptococcus species ? ?- Close exposure to students with  strep.  Will treat with Amox. ? ?Follow Up Instructions: ?I discussed the assessment and treatment plan with the patient. The patient was provided an opportunity to ask questions and all were answered. The patient agreed with the plan and demonstrated an understanding of the instructions.  A copy of instructions were sent to the patient via MyChart unless otherwise noted below.  ? ? ? ?The patient was advised to call back or seek an in-person evaluation if the symptoms worsen or if the condition fails to improve as  anticipated. ? ?Time:  ?I spent 10 minutes with the patient via telehealth technology discussing the above problems/concerns.   ? ?Doris Circle, PA-C ? ?

## 2022-03-20 NOTE — Patient Instructions (Signed)
I recommend that you stay out of school/work today.  I've sent a note through myChart if you need it. ? ?Take antibiotics as directed.  Follow-up with your doctor for new or worsening symptoms. ?

## 2022-04-01 ENCOUNTER — Telehealth: Payer: Medicaid Other | Admitting: Physician Assistant

## 2022-04-01 DIAGNOSIS — Z79899 Other long term (current) drug therapy: Secondary | ICD-10-CM

## 2022-04-01 NOTE — Progress Notes (Signed)
Patient was seen today virtually in virtual urgent care clinic. ? ?She is requesting re-start of GLP-1 for weight loss. ? ?She is unable to see her current PCP and is in need of a new PCP. ? ?Provided patient a list of options and she is interested in scheduling at [facility]. I have sent admin staff her information to reach out to her tomorrow to schedule. ? ?No charge. ? ?Jarold Motto PA-C ?

## 2022-04-06 DIAGNOSIS — Z419 Encounter for procedure for purposes other than remedying health state, unspecified: Secondary | ICD-10-CM | POA: Diagnosis not present

## 2022-04-15 ENCOUNTER — Ambulatory Visit: Payer: Medicaid Other | Admitting: Family

## 2022-05-07 DIAGNOSIS — Z419 Encounter for procedure for purposes other than remedying health state, unspecified: Secondary | ICD-10-CM | POA: Diagnosis not present

## 2022-06-06 DIAGNOSIS — Z419 Encounter for procedure for purposes other than remedying health state, unspecified: Secondary | ICD-10-CM | POA: Diagnosis not present

## 2022-07-03 ENCOUNTER — Ambulatory Visit: Payer: Medicaid Other | Admitting: Family

## 2022-07-07 DIAGNOSIS — Z419 Encounter for procedure for purposes other than remedying health state, unspecified: Secondary | ICD-10-CM | POA: Diagnosis not present

## 2022-07-15 ENCOUNTER — Encounter (INDEPENDENT_AMBULATORY_CARE_PROVIDER_SITE_OTHER): Payer: Self-pay

## 2022-08-07 DIAGNOSIS — Z419 Encounter for procedure for purposes other than remedying health state, unspecified: Secondary | ICD-10-CM | POA: Diagnosis not present

## 2022-09-06 DIAGNOSIS — Z419 Encounter for procedure for purposes other than remedying health state, unspecified: Secondary | ICD-10-CM | POA: Diagnosis not present

## 2022-09-07 ENCOUNTER — Ambulatory Visit: Payer: Medicaid Other | Admitting: Family

## 2022-10-07 DIAGNOSIS — Z419 Encounter for procedure for purposes other than remedying health state, unspecified: Secondary | ICD-10-CM | POA: Diagnosis not present

## 2022-11-06 DIAGNOSIS — Z419 Encounter for procedure for purposes other than remedying health state, unspecified: Secondary | ICD-10-CM | POA: Diagnosis not present

## 2022-11-11 ENCOUNTER — Telehealth: Payer: Medicaid Other | Admitting: Physician Assistant

## 2022-11-11 DIAGNOSIS — J039 Acute tonsillitis, unspecified: Secondary | ICD-10-CM | POA: Diagnosis not present

## 2022-11-11 DIAGNOSIS — Z20818 Contact with and (suspected) exposure to other bacterial communicable diseases: Secondary | ICD-10-CM | POA: Diagnosis not present

## 2022-11-11 MED ORDER — AMOXICILLIN 500 MG PO TABS: 500.0000 mg | ORAL_TABLET | Freq: Two times a day (BID) | ORAL | 0 refills | Status: AC

## 2022-11-11 NOTE — Progress Notes (Signed)
Message sent to patient requesting further input regarding current symptoms. Awaiting patient response.  

## 2022-11-11 NOTE — Progress Notes (Signed)
I have spent 5 minutes in review of e-visit questionnaire, review and updating patient chart, medical decision making and response to patient.   Felis Quillin Cody Shayne Deerman, PA-C    

## 2022-11-11 NOTE — Progress Notes (Signed)

## 2022-12-02 ENCOUNTER — Other Ambulatory Visit: Payer: Self-pay

## 2022-12-02 MED ORDER — SAXENDA 18 MG/3ML ~~LOC~~ SOPN
PEN_INJECTOR | SUBCUTANEOUS | 0 refills | Status: AC
  Filled 2022-12-03: qty 9, 28d supply, fill #0
  Filled 2022-12-03: qty 15, 28d supply, fill #0

## 2022-12-02 MED ORDER — SAXENDA 18 MG/3ML ~~LOC~~ SOPN
PEN_INJECTOR | SUBCUTANEOUS | 0 refills | Status: AC
  Filled 2022-12-02: qty 9, 30d supply, fill #0
  Filled 2023-05-27: qty 15, 30d supply, fill #0

## 2022-12-03 ENCOUNTER — Other Ambulatory Visit: Payer: Self-pay

## 2022-12-03 MED ORDER — SAXENDA 18 MG/3ML ~~LOC~~ SOPN: PEN_INJECTOR | SUBCUTANEOUS | 0 refills | Status: DC

## 2022-12-03 MED ORDER — UNIFINE PENTIPS 32G X 4 MM MISC
0 refills | Status: DC
  Filled 2022-12-03: qty 100, 90d supply, fill #0

## 2022-12-07 DIAGNOSIS — Z419 Encounter for procedure for purposes other than remedying health state, unspecified: Secondary | ICD-10-CM | POA: Diagnosis not present

## 2023-01-07 DIAGNOSIS — Z419 Encounter for procedure for purposes other than remedying health state, unspecified: Secondary | ICD-10-CM | POA: Diagnosis not present

## 2023-02-05 DIAGNOSIS — Z419 Encounter for procedure for purposes other than remedying health state, unspecified: Secondary | ICD-10-CM | POA: Diagnosis not present

## 2023-02-12 ENCOUNTER — Other Ambulatory Visit: Payer: Self-pay

## 2023-02-12 MED ORDER — SAXENDA 18 MG/3ML ~~LOC~~ SOPN
PEN_INJECTOR | SUBCUTANEOUS | 0 refills | Status: DC
  Filled 2023-02-12 – 2023-05-27 (×2): qty 15, 30d supply, fill #0

## 2023-02-19 ENCOUNTER — Other Ambulatory Visit: Payer: Self-pay

## 2023-02-24 ENCOUNTER — Other Ambulatory Visit: Payer: Self-pay

## 2023-03-08 DIAGNOSIS — Z419 Encounter for procedure for purposes other than remedying health state, unspecified: Secondary | ICD-10-CM | POA: Diagnosis not present

## 2023-04-07 DIAGNOSIS — Z419 Encounter for procedure for purposes other than remedying health state, unspecified: Secondary | ICD-10-CM | POA: Diagnosis not present

## 2023-05-08 DIAGNOSIS — Z419 Encounter for procedure for purposes other than remedying health state, unspecified: Secondary | ICD-10-CM | POA: Diagnosis not present

## 2023-05-28 ENCOUNTER — Other Ambulatory Visit: Payer: Self-pay

## 2023-06-07 DIAGNOSIS — Z419 Encounter for procedure for purposes other than remedying health state, unspecified: Secondary | ICD-10-CM | POA: Diagnosis not present

## 2023-07-08 DIAGNOSIS — Z419 Encounter for procedure for purposes other than remedying health state, unspecified: Secondary | ICD-10-CM | POA: Diagnosis not present

## 2023-08-08 DIAGNOSIS — Z419 Encounter for procedure for purposes other than remedying health state, unspecified: Secondary | ICD-10-CM | POA: Diagnosis not present

## 2023-09-07 DIAGNOSIS — Z419 Encounter for procedure for purposes other than remedying health state, unspecified: Secondary | ICD-10-CM | POA: Diagnosis not present

## 2023-10-08 DIAGNOSIS — Z419 Encounter for procedure for purposes other than remedying health state, unspecified: Secondary | ICD-10-CM | POA: Diagnosis not present

## 2023-11-07 DIAGNOSIS — Z419 Encounter for procedure for purposes other than remedying health state, unspecified: Secondary | ICD-10-CM | POA: Diagnosis not present

## 2023-12-08 DIAGNOSIS — Z419 Encounter for procedure for purposes other than remedying health state, unspecified: Secondary | ICD-10-CM | POA: Diagnosis not present

## 2024-06-30 ENCOUNTER — Ambulatory Visit: Admitting: Family Medicine

## 2024-07-01 ENCOUNTER — Telehealth: Admitting: Family Medicine

## 2024-07-01 NOTE — Progress Notes (Signed)
 Pt did not show up for visit-[facility]

## 2024-07-03 ENCOUNTER — Encounter: Payer: Self-pay | Admitting: Nurse Practitioner

## 2024-07-03 ENCOUNTER — Telehealth: Payer: Self-pay

## 2024-07-03 ENCOUNTER — Other Ambulatory Visit: Payer: Self-pay | Admitting: Nurse Practitioner

## 2024-07-03 ENCOUNTER — Ambulatory Visit (INDEPENDENT_AMBULATORY_CARE_PROVIDER_SITE_OTHER): Admitting: Nurse Practitioner

## 2024-07-03 VITALS — BP 122/78 | HR 97 | Temp 98.6°F | Resp 18 | Ht 65.25 in | Wt 227.4 lb

## 2024-07-03 DIAGNOSIS — E6609 Other obesity due to excess calories: Secondary | ICD-10-CM | POA: Insufficient documentation

## 2024-07-03 DIAGNOSIS — Z6837 Body mass index (BMI) 37.0-37.9, adult: Secondary | ICD-10-CM

## 2024-07-03 DIAGNOSIS — Z131 Encounter for screening for diabetes mellitus: Secondary | ICD-10-CM

## 2024-07-03 DIAGNOSIS — Z7689 Persons encountering health services in other specified circumstances: Secondary | ICD-10-CM

## 2024-07-03 DIAGNOSIS — Z13220 Encounter for screening for lipoid disorders: Secondary | ICD-10-CM

## 2024-07-03 DIAGNOSIS — Z130 Encounter for screening for diseases of the blood and blood-forming organs and certain disorders involving the immune mechanism: Secondary | ICD-10-CM | POA: Diagnosis not present

## 2024-07-03 DIAGNOSIS — E66812 Obesity, class 2: Secondary | ICD-10-CM

## 2024-07-03 DIAGNOSIS — Z1159 Encounter for screening for other viral diseases: Secondary | ICD-10-CM

## 2024-07-03 MED ORDER — ZEPBOUND 2.5 MG/0.5ML ~~LOC~~ SOAJ: 2.5000 mg | SUBCUTANEOUS | 0 refills | Status: DC

## 2024-07-03 MED ORDER — WEGOVY 0.25 MG/0.5ML ~~LOC~~ SOAJ: 0.2500 mg | SUBCUTANEOUS | 0 refills | Status: DC

## 2024-07-03 NOTE — Telephone Encounter (Unsigned)
 Copied from CRM [id number]. Topic: Clinical - Medication Question >> Jul 03, 2024  4:00 PM Yolanda T wrote: Reason for CRM: patient called stated the Wegovy  is not a covered medication however Phentermine  is covered by her insurance. Patient is requesting a script sent to her pharmacy

## 2024-07-03 NOTE — Progress Notes (Signed)
 BP 122/78   Pulse 97   Temp 98.6 F (37 C)   Resp 18   Ht 5' 5.25 (1.657 m)   Wt 227 lb 6.4 oz (103.1 kg)   LMP 06/22/2024   SpO2 99%   BMI 37.55 kg/m    Subjective:    Patient ID: Doris Thomas, female    DOB: Jun 01, 1996, 28 y.o.   MRN: [id number]  HPI: Doris Thomas is a 28 y.o. female  Chief Complaint  Patient presents with   Establish Care   Obesity    Discuss weight loss options    Discussed the use of AI scribe software for clinical note transcription with the patient, who gave verbal consent to proceed.  History of Present Illness Doris Thomas is a 28 year old female who presents to establish care and discuss weight loss medication.  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 - Treated with anticoagulation for several months, now resolved  Hypertensive disorders of pregnancy - History of elevated blood pressure during pregnancy  Menstrual cycle and contraceptive considerations - Menstrual cycle impacts eating habits - Considering initiation of birth control, specifically interested in intrauterine device (IUD) due to history of blood clots - No prior use of birth control - Seeking options to help manage menstrual periods and associated symptoms     Starting weight: 227 lbs Starting BMI: 37.55 Waist Measurement : 40.5 inches  Diet: cutting out  sweets, drinking more water Exercise: walking daily  - Encourage continuation of lifestyle modifications, including dietary management and regular exercise. -continue to increase physical activity, getting at least 150 min of physical activity a week.  Work on including [employer], broadcasting/film/video 2 days a week.  - continue eating at a calorie deficit 1600-1700 cal a day, eating a well balanced diet with whole foods, avoiding processed foods.   Patient is motivated to continue working on lifestyle modification.       07/03/2024    1:17 PM 01/03/2020    8:29 AM 10/18/2017    3:37 PM  Depression screen PHQ 2/9  Decreased Interest 0 0 0  Down, Depressed, Hopeless 0 0 0  PHQ - 2 Score 0 0 0  Altered sleeping 0  0  Tired, decreased energy 0  1  Change in appetite 0  1  Feeling bad or failure about yourself  0  0  Trouble concentrating 0  0  Moving slowly or fidgety/restless 0  0  Suicidal thoughts 0  0  PHQ-9 Score 0  2  Difficult doing work/chores Not difficult at all      Relevant past medical, surgical, family and social history reviewed and updated as indicated. Interim medical history since our last visit reviewed. Allergies and medications reviewed and updated.  Review of Systems  Per HPI unless specifically indicated above     Objective:     BP 122/78   Pulse 97   Temp 98.6 F (37 C)   Resp  18   Ht 5' 5.25 (1.657 m)   Wt 227 lb 6.4 oz (103.1 kg)   LMP 06/22/2024   SpO2 99%   BMI 37.55 kg/m    Wt Readings from Last 3 Encounters:  07/03/24 227 lb 6.4 oz (103.1 kg)  02/02/22 217 lb (98.4 kg)  11/25/20 212 lb 8 oz (96.4 kg)    Physical Exam Physical Exam MEASUREMENTS: Weight- 227, BMI- 37.55. GENERAL: Alert, cooperative, well developed, no acute distress HEENT: Normocephalic, normal oropharynx, moist mucous membranes CHEST: Clear to auscultation bilaterally, No wheezes, rhonchi, or crackles CARDIOVASCULAR: Normal heart rate and rhythm, S1 and S2 normal without  murmurs ABDOMEN: Soft, non-tender, non-distended, without organomegaly, Normal bowel sounds EXTREMITIES: No cyanosis or edema NEUROLOGICAL: Cranial nerves grossly intact, Moves all extremities without gross motor or sensory deficit   Results for orders placed or performed in visit on 02/02/22  Cervicovaginal ancillary only   Collection Time: 02/02/22  3:13 PM  Result Value Ref Range   Neisseria Gonorrhea Negative    Chlamydia Negative    Trichomonas Negative    Bacterial Vaginitis (gardnerella) Negative    Comment      Normal Reference Range Bacterial Vaginosis - Negative   Comment Normal Reference Range Trichomonas - Negative    Comment Normal Reference Ranger Chlamydia - Negative    Comment      Normal Reference Range Neisseria Gonorrhea - Negative          Assessment & Plan:   Problem List Items Addressed This Visit       Other   Class 2 obesity due to excess calories without serious comorbidity with body mass index (BMI) of 37.0 to 37.9 in adult - Primary   Relevant Medications   tirzepatide  (ZEPBOUND ) 2.5 MG/0.5ML Pen   Other Relevant Orders   TSH   Other Visit Diagnoses       Encounter to establish care         Screening for deficiency anemia       Relevant Orders   CBC with Differential/Platelet     Screening for cholesterol level       Relevant Orders   Lipid panel     Screening for diabetes mellitus       Relevant Orders   Comprehensive metabolic panel with GFR   Hemoglobin A1c     Encounter for hepatitis C screening test for low risk patient       Relevant Orders   Hepatitis C antibody        Assessment and Plan Assessment & Plan Establish care Initial visit to establish care with a focus on weight management and birth control options. Discussed the importance of selecting birth control methods that do not increase the risk of blood clots due to her history of DVT and pulmonary embolism. - Establish care with [facility] for birth control  options. - Discussed IUD or Nexplanon as preferred birth control methods due to history of blood clots. - Encouraged her to research birth control options and discuss with OBGYN.  Obesity Obesity with a BMI of 37.55 and a weight of 227 pounds. Previous experience with phentermine  and Saxenda . Interested in weight loss medications, particularly injectables, but concerned about insurance coverage. Discussed phentermine  as a low-cost option with potential side effects such as headaches, dry mouth, and palpitations. Discussed Contrave as an alternative with potential side effects including dizziness, nausea, and headaches. Discussed injectables like Zepbound  and Wegovy , noting side effects such as abdominal pain, constipation, and nausea.  Emphasized the importance of hydration to mitigate side effects. - Attempt prior authorization for injectable weight loss medication (Zepbound  or Wegovy ). - If injectable is not approved, prescribe phentermine . - Schedule follow-up every three months for weight checks and monitoring. - Encourage dietary changes including reducing sugar intake, increasing water consumption, and measuring food portions. - Recommend increasing strength training to 2-3 times a week for 20 minutes. - Provide educational materials on weight loss and calorie intake.  History of deep vein thrombosis and pulmonary embolism DVT during pregnancy leading to pulmonary embolism, previously treated with blood thinners. No current anticoagulation therapy. Discussed avoiding estrogen-containing birth control due to history of blood clots. - Avoid estrogen-containing birth control due to history of blood clots.        Follow up plan: Return in about 3 months (around 10/03/2024) for follow up.

## 2024-07-03 NOTE — Patient Instructions (Signed)
 Healthy Weight Loss Guide ?? Weight Loss Goal -aim for 1600-1800 calories a day - Aim for 1-2 pounds per week - Target: 5-10% of your starting body weight over 3-6 months ??? Nutrition Tips - Eat 3 meals per day and avoid skipping meals - Fill half your plate with vegetables, a quarter with protein, a quarter with whole grains - Choose lean proteins: chicken, fish, eggs, tofu, beans - Limit: - Sugary drinks (soda, sweet tea, juice) - Fried foods and fast food - Processed snacks (chips, candy, cookies) - Drink at least 64 oz of water per day - Practice portion control and mindful eating ???? Lifestyle Habits - Track what you eat (apps like MyFitnessPal, Lose It!, or a paper log) - Get 7-9 hours of sleep per night - Manage stress (meditation, breathing exercises, counseling if needed) - Limit alcohol (empty calories and may increase hunger) ???? Exercise Recommendations - Goal: 150 minutes per week of moderate activity (e.g., brisk walking, cycling) - Start with 10-15 minutes/day and build up gradually - Add 2 days per week of strength training (light weights, resistance bands, or bodyweight) ?? Remember: Progress > Perfection Small changes every day add up. Don't give up! - Avoid high-fat or greasy foods to reduce nausea - Focus on protein at each meal to preserve muscle mass - Stay well hydrated (at least 64 oz water per day) - Limit sugar and processed carbohydrates ?? Managing Side Effects if on weight loss medication - Eat slowly and stop eating when you feel full - Use anti-nausea strategies: ginger tea, peppermint, crackers - Talk to your provider about adjusting the dose if needed - Stool softeners or fiber supplements can help with constipation ?? Staying on Track - Track weight and non-scale victories (energy, clothing fit, labs) - Follow up with your provider regularly - Don't stop medication without medical guidance - Combine medication with healthy habits for best  results ?? Remember Weight loss medications are a tool, not a shortcut. Healthy habits matter. Be patient and consistent--small changes lead to big results.

## 2024-07-04 ENCOUNTER — Ambulatory Visit: Payer: Self-pay

## 2024-07-04 ENCOUNTER — Other Ambulatory Visit: Payer: Self-pay | Admitting: Nurse Practitioner

## 2024-07-04 DIAGNOSIS — E6609 Other obesity due to excess calories: Secondary | ICD-10-CM

## 2024-07-04 LAB — CBC WITH DIFFERENTIAL/PLATELET
Absolute Lymphocytes: 2204 {cells}/uL (ref 850–3900)
Absolute Monocytes: 471 {cells}/uL (ref 200–950)
Basophils Absolute: 68 {cells}/uL (ref 0–200)
Basophils Relative: 0.9 %
Eosinophils Absolute: 61 {cells}/uL (ref 15–500)
Eosinophils Relative: 0.8 %
HCT: 39.4 % (ref 35.0–45.0)
Hemoglobin: 12.4 g/dL (ref 11.7–15.5)
MCH: 27 pg (ref 27.0–33.0)
MCHC: 31.5 g/dL — ABNORMAL LOW (ref 32.0–36.0)
MCV: 85.8 fL (ref 80.0–100.0)
MPV: 10.8 fL (ref 7.5–12.5)
Monocytes Relative: 6.2 %
Neutro Abs: 4796 {cells}/uL (ref 1500–7800)
Neutrophils Relative %: 63.1 %
Platelets: 341 Thousand/uL (ref 140–400)
RBC: 4.59 Million/uL (ref 3.80–5.10)
RDW: 15.5 % — ABNORMAL HIGH (ref 11.0–15.0)
Total Lymphocyte: 29 %
WBC: 7.6 Thousand/uL (ref 3.8–10.8)

## 2024-07-04 LAB — COMPREHENSIVE METABOLIC PANEL WITH GFR
AG Ratio: 1.2 (calc) (ref 1.0–2.5)
ALT: 12 U/L (ref 6–29)
AST: 14 U/L (ref 10–30)
Albumin: 4.5 g/dL (ref 3.6–5.1)
Alkaline phosphatase (APISO): 87 U/L (ref 31–125)
BUN: 7 mg/dL (ref 7–25)
CO2: 26 mmol/L (ref 20–32)
Calcium: 9.8 mg/dL (ref 8.6–10.2)
Chloride: 103 mmol/L (ref 98–110)
Creat: 0.71 mg/dL (ref 0.50–0.96)
Globulin: 3.8 g/dL — ABNORMAL HIGH (ref 1.9–3.7)
Glucose, Bld: 83 mg/dL (ref 65–99)
Potassium: 5 mmol/L (ref 3.5–5.3)
Sodium: 138 mmol/L (ref 135–146)
Total Bilirubin: 0.3 mg/dL (ref 0.2–1.2)
Total Protein: 8.3 g/dL — ABNORMAL HIGH (ref 6.1–8.1)
eGFR: 118 mL/min/1.73m2 (ref >=60)

## 2024-07-04 LAB — LIPID PANEL
Cholesterol: 130 mg/dL (ref <200)
HDL: 42 mg/dL — ABNORMAL LOW (ref >=50)
LDL Cholesterol (Calc): 72 mg/dL
Non-HDL Cholesterol (Calc): 88 mg/dL (ref <130)
Total CHOL/HDL Ratio: 3.1 (calc) (ref <5.0)
Triglycerides: 80 mg/dL (ref <150)

## 2024-07-04 LAB — TSH: TSH: 1.52 m[IU]/L

## 2024-07-04 LAB — HEMOGLOBIN A1C
Hgb A1c MFr Bld: 5.5 % (ref <5.7)
Mean Plasma Glucose: 111 mg/dL
eAG (mmol/L): 6.2 mmol/L

## 2024-07-04 LAB — HEPATITIS C ANTIBODY: Hepatitis C Ab: NONREACTIVE

## 2024-07-04 MED ORDER — PHENTERMINE HCL 37.5 MG PO TABS: ORAL_TABLET | ORAL | 0 refills | Status: AC

## 2024-07-05 ENCOUNTER — Ambulatory Visit: Payer: Self-pay | Admitting: Nurse Practitioner

## 2024-07-09 ENCOUNTER — Telehealth: Admitting: Nurse Practitioner

## 2024-07-09 ENCOUNTER — Encounter

## 2024-07-09 DIAGNOSIS — L732 Hidradenitis suppurativa: Secondary | ICD-10-CM | POA: Diagnosis not present

## 2024-07-09 MED ORDER — CHLORHEXIDINE GLUCONATE 4 % EX SOLN: CUTANEOUS | 1 refills | Status: AC

## 2024-07-09 MED ORDER — CLINDAMYCIN PHOSPHATE 1 % EX SOLN: Freq: Two times a day (BID) | CUTANEOUS | 0 refills | Status: AC

## 2024-07-09 NOTE — Patient Instructions (Signed)
  Erma DELENA Boos, thank you for joining Haze LELON Servant, NP for today's virtual visit.  While this provider is not your primary care provider (PCP), if your PCP is located in our provider database this encounter information will be shared with them immediately following your visit.   A [facility] MyChart account gives you access to today's visit and all your visits, tests, and labs performed at [facility]  click here if you don't have a [facility] MyChart account or go to [email or web address]  Consent: (Patient) Doris Thomas provided verbal consent for this virtual visit at the beginning of the encounter.  Current Medications:  Current Outpatient Medications:    chlorhexidine  (HIBICLENS ) 4 % external liquid, Use as body wash in areas of skin damage, Disp: 473 mL, Rfl: 1   clindamycin  (CLEOCIN  T) 1 % external solution, Apply topically 2 (two) times daily. To affected areas., Disp: 60 mL, Rfl: 0   phentermine  (ADIPEX-P ) 37.5 MG tablet, One tab by mouth qAM, Disp: 90 tablet, Rfl: 0   Medications ordered in this encounter:  Meds ordered this encounter  Medications   clindamycin  (CLEOCIN  T) 1 % external solution    Sig: Apply topically 2 (two) times daily. To affected areas.    Dispense:  60 mL    Refill:  0    Supervising Provider:   BLAISE ALEENE KIDD [id number]   chlorhexidine  (HIBICLENS ) 4 % external liquid    Sig: Use as body wash in areas of skin damage    Dispense:  473 mL    Refill:  1    Supervising Provider:   BLAISE ALEENE KIDD [[id number]]     *If you need refills on other medications prior to your next appointment, please contact your pharmacy*  Follow-Up: Call back or seek an in-person evaluation if the symptoms worsen or if the condition fails to improve as anticipated.  [facility] [phone]    If you have been instructed to have an in-person evaluation today at a local Urgent Care facility, please use the link below. It will take you  to a list of all of our available [facility], including address, phone number and hours of operation. Please do not delay care.  [facility]  If you or a family member do not have a primary care provider, use the link below to schedule a visit and establish care. When you choose a [facility] primary care physician or advanced practice provider, you gain a long-term partner in health. Find a Primary Care Provider  Learn more about [facility]'s in-office and virtual care options: [facility] - Get Care Now

## 2024-07-09 NOTE — Progress Notes (Signed)
 Virtual Visit Consent   Doris Thomas, you are scheduled for a virtual visit with a [facility] provider today. Just as with appointments in the office, your consent must be obtained to participate. Your consent will be active for this visit and any virtual visit you may have with one of our providers in the next 365 days. If you have a MyChart account, a copy of this consent can be sent to you electronically.  As this is a virtual visit, video technology does not allow for your provider to perform a traditional examination. This may limit your provider's ability to fully assess your condition. If your provider identifies any concerns that need to be evaluated in person or the need to arrange testing (such as labs, EKG, etc.), we will make arrangements to do so. Although advances in technology are sophisticated, we cannot ensure that it will always work on either your end or our end. If the connection with a video visit is poor, the visit may have to be switched to a telephone visit. With either a video or telephone visit, we are not always able to ensure that we have a secure connection.  By engaging in this virtual visit, you consent to the provision of healthcare and authorize for your insurance to be billed (if applicable) for the services provided during this visit. Depending on your insurance coverage, you may receive a charge related to this service.  I need to obtain your verbal consent now. Are you willing to proceed with your visit today? Doris Thomas has provided verbal consent on 07/09/2024 for a virtual visit (video or telephone). Doris LELON Servant, NP  Date: 07/09/2024 11:26 AM   Virtual Visit via Video Note   I, Doris Thomas, connected with  Doris Thomas  ([id number], 1996/06/16) on 07/09/24 at 11:00 AM EDT by a video-enabled telemedicine application and verified that I am speaking with the correct person using two identifiers.  Location: Patient: Virtual Visit Location Patient:  Home Provider: Virtual Visit Location Provider: Home Office   I discussed the limitations of evaluation and management by telemedicine and the availability of in person appointments. The patient expressed understanding and agreed to proceed.    History of Present Illness: Doris Thomas is a 28 y.o. who identifies as a female who was assigned female at birth, and is being seen today for hidradenitis flare.  [name] is currently experiencing a hidradenitis flare in the groin area. There are 2 areas of inflammation with lesions that have erupted.    Problems:  Patient Active Problem List   Diagnosis Date Noted   Class 2 obesity due to excess calories without serious comorbidity with body mass index (BMI) of 37.0 to 37.9 in adult 07/03/2024   Neck strain, initial encounter 02/27/2021   Nausea without vomiting 02/27/2021   Pulmonary embolism (HCC) 08/29/2020   Elevated blood pressure reading 08/29/2020   Anemia 08/29/2020   Family history of osteogenesis imperfecta 01/04/2020   Vitamin D  deficiency 10/18/2017    Allergies: No Known Allergies Medications:  Current Outpatient Medications:    chlorhexidine  (HIBICLENS ) 4 % external liquid, Use as body wash in areas of skin damage, Disp: 473 mL, Rfl: 1   clindamycin  (CLEOCIN  T) 1 % external solution, Apply topically 2 (two) times daily. To affected areas., Disp: 60 mL, Rfl: 0   phentermine  (ADIPEX-P ) 37.5 MG tablet, One tab by mouth qAM, Disp: 90 tablet, Rfl: 0  Observations/Objective: Patient is well-developed, well-nourished in no acute  distress.  Resting comfortably at home.  Head is normocephalic, atraumatic.  No labored breathing.  Speech is clear and coherent with logical content.  Patient is alert and oriented at baseline.  No visible signs of cellulitis or surrounding skin infection    Assessment and Plan: 1. Hidradenitis suppurativa (Primary) - clindamycin  (CLEOCIN  T) 1 % external solution; Apply topically 2 (two) times  daily. To affected areas.  Dispense: 60 mL; Refill: 0 - chlorhexidine  (HIBICLENS ) 4 % external liquid; Use as body wash in areas of skin damage  Dispense: 473 mL; Refill: 1   Follow Up Instructions: I discussed the assessment and treatment plan with the patient. The patient was provided an opportunity to ask questions and all were answered. The patient agreed with the plan and demonstrated an understanding of the instructions.  A copy of instructions were sent to the patient via MyChart unless otherwise noted below.    The patient was advised to call back or seek an in-person evaluation if the symptoms worsen or if the condition fails to improve as anticipated.    Tearia Gibbs W Atira Borello, NP

## 2024-10-04 ENCOUNTER — Ambulatory Visit: Admitting: Nurse Practitioner

## 2024-10-04 DIAGNOSIS — Z23 Encounter for immunization: Secondary | ICD-10-CM

## 2024-10-04 NOTE — Progress Notes (Deleted)
 There were no vitals taken for this visit.   Subjective:    Patient ID: Doris Thomas, female    DOB: 1996-09-06, 28 y.o.   MRN: [id number]  HPI: Doris Thomas is a 28 y.o. female with a history of a pulmonary embolism, anemia, obesity, and vitamin D  deficiency who presents today for a 3 month follow-up for weight management. She started Phentermine  37.5 mg back in July 2025. She has been adherent to medication daily and denies any adverse effects. She has noticed a decrease in her appetite.     Starting weight was 227 lbs  Starting BMI:37.55 Starting Waist Measurement: 40.5 inches           07/03/2024    1:17 PM 01/03/2020    8:29 AM 10/18/2017    3:37 PM  Depression screen PHQ 2/9  Decreased Interest 0 0 0  Down, Depressed, Hopeless 0 0 0  PHQ - 2 Score 0 0 0  Altered sleeping 0  0  Tired, decreased energy 0  1  Change in appetite 0  1  Feeling bad or failure about yourself  0  0  Trouble concentrating 0  0  Moving slowly or fidgety/restless 0  0  Suicidal thoughts 0  0  PHQ-9 Score 0  2  Difficult doing work/chores Not difficult at all      Relevant past medical, surgical, family and social history reviewed and updated as indicated. Interim medical history since our last visit reviewed. Allergies and medications reviewed and updated.  Review of Systems  Per HPI unless specifically indicated above     Objective:     There were no vitals taken for this visit.  {Vitals History (Optional):23777} Wt Readings from Last 3 Encounters:  07/03/24 227 lb 6.4 oz (103.1 kg)  02/02/22 217 lb (98.4 kg)  11/25/20 212 lb 8 oz (96.4 kg)    Physical Exam   Results for orders placed or performed in visit on 07/03/24  CBC with Differential/Platelet   Collection Time: 07/03/24  1:56 PM  Result Value Ref Range   WBC 7.6 3.8 - 10.8 Thousand/uL   RBC 4.59 3.80 - 5.10 Million/uL   Hemoglobin 12.4 11.7 - 15.5 g/dL   HCT 60.5 64.9 - 54.9 %   MCV 85.8 80.0 - 100.0 fL   MCH  27.0 27.0 - 33.0 pg   MCHC 31.5 (L) 32.0 - 36.0 g/dL   RDW 84.4 (H) 88.9 - 84.9 %   Platelets 341 140 - 400 Thousand/uL   MPV 10.8 7.5 - 12.5 fL   Neutro Abs 4,796 1,500 - 7,800 cells/uL   Absolute Lymphocytes 2,204 850 - 3,900 cells/uL   Absolute Monocytes 471 200 - 950 cells/uL   Eosinophils Absolute 61 15 - 500 cells/uL   Basophils Absolute 68 0 - 200 cells/uL   Neutrophils Relative % 63.1 %   Total Lymphocyte 29.0 %   Monocytes Relative 6.2 %   Eosinophils Relative 0.8 %   Basophils Relative 0.9 %  Comprehensive metabolic panel with GFR   Collection Time: 07/03/24  1:56 PM  Result Value Ref Range   Glucose, Bld 83 65 - 99 mg/dL   BUN 7 7 - 25 mg/dL   Creat 9.28 9.49 - 9.03 mg/dL   eGFR 881 > OR = 60 fO/fpw/8.26f7   BUN/Creatinine Ratio SEE NOTE: 6 - 22 (calc)   Sodium 138 135 - 146 mmol/L   Potassium 5.0 3.5 - 5.3 mmol/L   Chloride 103 98 -  110 mmol/L   CO2 26 20 - 32 mmol/L   Calcium 9.8 8.6 - 10.2 mg/dL   Total Protein 8.3 (H) 6.1 - 8.1 g/dL   Albumin 4.5 3.6 - 5.1 g/dL   Globulin 3.8 (H) 1.9 - 3.7 g/dL (calc)   AG Ratio 1.2 1.0 - 2.5 (calc)   Total Bilirubin 0.3 0.2 - 1.2 mg/dL   Alkaline phosphatase (APISO) 87 31 - 125 U/L   AST 14 10 - 30 U/L   ALT 12 6 - 29 U/L  Lipid panel   Collection Time: 07/03/24  1:56 PM  Result Value Ref Range   Cholesterol 130 <200 mg/dL   HDL 42 (L) > OR = 50 mg/dL   Triglycerides 80 <849 mg/dL   LDL Cholesterol (Calc) 72 mg/dL (calc)   Total CHOL/HDL Ratio 3.1 <5.0 (calc)   Non-HDL Cholesterol (Calc) 88 <869 mg/dL (calc)  Hemoglobin J8r   Collection Time: 07/03/24  1:56 PM  Result Value Ref Range   Hgb A1c MFr Bld 5.5 <5.7 %   Mean Plasma Glucose 111 mg/dL   eAG (mmol/L) 6.2 mmol/L  Hepatitis C antibody   Collection Time: 07/03/24  1:56 PM  Result Value Ref Range   Hepatitis C Ab NON-REACTIVE NON-REACTIVE  TSH   Collection Time: 07/03/24  1:56 PM  Result Value Ref Range   TSH 1.52 mIU/L   {Labs (Optional):23779}        Assessment & Plan:   Problem List Items Addressed This Visit   None Visit Diagnoses       Immunization due    -  Primary         Starting weight:227 lb Weight today:         Follow up plan: No follow-ups on file.

## 2024-10-25 ENCOUNTER — Ambulatory Visit: Admitting: Nurse Practitioner

## 2024-10-25 NOTE — Progress Notes (Deleted)
 There were no vitals taken for this visit.   Subjective:    Patient ID: Doris Thomas, female    DOB: 05-23-1996, 28 y.o.   MRN: [id number]  HPI: Doris Thomas is a 28 y.o. female presenting today for a 3 month follow-up for obesity and weight management. She was started on phentermine  in July. She has been taking mediation daily. Weight in July was 227 lb with a BMI of 37.55.           07/03/2024    1:17 PM 01/03/2020    8:29 AM 10/18/2017    3:37 PM  Depression screen PHQ 2/9  Decreased Interest 0 0 0  Down, Depressed, Hopeless 0 0 0  PHQ - 2 Score 0 0 0  Altered sleeping 0  0  Tired, decreased energy 0  1  Change in appetite 0  1  Feeling bad or failure about yourself  0  0  Trouble concentrating 0  0  Moving slowly or fidgety/restless 0  0  Suicidal thoughts 0  0  PHQ-9 Score 0   2   Difficult doing work/chores Not difficult at all       Data saved with a previous flowsheet row definition    Relevant past medical, surgical, family and social history reviewed and updated as indicated. Interim medical history since our last visit reviewed. Allergies and medications reviewed and updated.  Review of Systems  Ten systems reviewed and is negative except as mentioned in HPI      Objective:     There were no vitals taken for this visit.  {Vitals History (Optional):23777} Wt Readings from Last 3 Encounters:  07/03/24 227 lb 6.4 oz (103.1 kg)  02/02/22 217 lb (98.4 kg)  11/25/20 212 lb 8 oz (96.4 kg)    Physical Exam   Results for orders placed or performed in visit on 07/03/24  CBC with Differential/Platelet   Collection Time: 07/03/24  1:56 PM  Result Value Ref Range   WBC 7.6 3.8 - 10.8 Thousand/uL   RBC 4.59 3.80 - 5.10 Million/uL   Hemoglobin 12.4 11.7 - 15.5 g/dL   HCT 60.5 64.9 - 54.9 %   MCV 85.8 80.0 - 100.0 fL   MCH 27.0 27.0 - 33.0 pg   MCHC 31.5 (L) 32.0 - 36.0 g/dL   RDW 84.4 (H) 88.9 - 84.9 %   Platelets 341 140 - 400 Thousand/uL   MPV 10.8  7.5 - 12.5 fL   Neutro Abs 4,796 1,500 - 7,800 cells/uL   Absolute Lymphocytes 2,204 850 - 3,900 cells/uL   Absolute Monocytes 471 200 - 950 cells/uL   Eosinophils Absolute 61 15 - 500 cells/uL   Basophils Absolute 68 0 - 200 cells/uL   Neutrophils Relative % 63.1 %   Total Lymphocyte 29.0 %   Monocytes Relative 6.2 %   Eosinophils Relative 0.8 %   Basophils Relative 0.9 %  Comprehensive metabolic panel with GFR   Collection Time: 07/03/24  1:56 PM  Result Value Ref Range   Glucose, Bld 83 65 - 99 mg/dL   BUN 7 7 - 25 mg/dL   Creat 9.28 9.49 - 9.03 mg/dL   eGFR 881 > OR = 60 fO/fpw/8.26f7   BUN/Creatinine Ratio SEE NOTE: 6 - 22 (calc)   Sodium 138 135 - 146 mmol/L   Potassium 5.0 3.5 - 5.3 mmol/L   Chloride 103 98 - 110 mmol/L   CO2 26 20 - 32 mmol/L   Calcium  9.8 8.6 - 10.2 mg/dL   Total Protein 8.3 (H) 6.1 - 8.1 g/dL   Albumin 4.5 3.6 - 5.1 g/dL   Globulin 3.8 (H) 1.9 - 3.7 g/dL (calc)   AG Ratio 1.2 1.0 - 2.5 (calc)   Total Bilirubin 0.3 0.2 - 1.2 mg/dL   Alkaline phosphatase (APISO) 87 31 - 125 U/L   AST 14 10 - 30 U/L   ALT 12 6 - 29 U/L  Lipid panel   Collection Time: 07/03/24  1:56 PM  Result Value Ref Range   Cholesterol 130 <200 mg/dL   HDL 42 (L) > OR = 50 mg/dL   Triglycerides 80 <849 mg/dL   LDL Cholesterol (Calc) 72 mg/dL (calc)   Total CHOL/HDL Ratio 3.1 <5.0 (calc)   Non-HDL Cholesterol (Calc) 88 <869 mg/dL (calc)  Hemoglobin J8r   Collection Time: 07/03/24  1:56 PM  Result Value Ref Range   Hgb A1c MFr Bld 5.5 <5.7 %   Mean Plasma Glucose 111 mg/dL   eAG (mmol/L) 6.2 mmol/L  Hepatitis C antibody   Collection Time: 07/03/24  1:56 PM  Result Value Ref Range   Hepatitis C Ab NON-REACTIVE NON-REACTIVE  TSH   Collection Time: 07/03/24  1:56 PM  Result Value Ref Range   TSH 1.52 mIU/L   {Labs (Optional):23779}       Assessment & Plan:   Problem List Items Addressed This Visit   None    Assessment and Plan         Follow up plan: No  follow-ups on file.

## 2025-01-04 ENCOUNTER — Ambulatory Visit: Admitting: Nurse Practitioner

## 2025-01-12 ENCOUNTER — Telehealth: Admitting: Family Medicine

## 2025-01-12 DIAGNOSIS — L709 Acne, unspecified: Secondary | ICD-10-CM

## 2025-01-12 NOTE — Progress Notes (Signed)
" ° °  Thank you for the details you included in the comment boxes. Those details are very helpful in determining the best course of treatment for you and help us  to provide the best care.Because Ms. Knippenberg, we recommend that you schedule a Virtual Urgent Care video visit in order for the provider to better assess what is going on.  The provider will be able to give you a more accurate diagnosis and treatment plan if we can more freely discuss your symptoms and with the addition of a virtual examination.   If you change your visit to a video visit, we will bill your insurance (similar to an office visit) and you will not be charged for this e-Visit. You will be able to stay at home and speak with the first available [facility] advanced practice provider. The link to do a video visit is in the drop down Menu tab of your Welcome screen in MyChart.        "
